# Patient Record
Sex: Female | Born: 1955 | Race: White | Hispanic: No | Marital: Single | Smoking: Never smoker
Health system: Southern US, Community
[De-identification: ages and names within clinical notes are randomized; demographics above are authoritative.]

## PROBLEM LIST (undated history)

## (undated) DIAGNOSIS — Z9889 Other specified postprocedural states: Secondary | ICD-10-CM

## (undated) DIAGNOSIS — R112 Nausea with vomiting, unspecified: Secondary | ICD-10-CM

## (undated) DIAGNOSIS — K219 Gastro-esophageal reflux disease without esophagitis: Secondary | ICD-10-CM

## (undated) DIAGNOSIS — T4145XA Adverse effect of unspecified anesthetic, initial encounter: Secondary | ICD-10-CM

## (undated) DIAGNOSIS — T8859XA Other complications of anesthesia, initial encounter: Secondary | ICD-10-CM

## (undated) DIAGNOSIS — H409 Unspecified glaucoma: Secondary | ICD-10-CM

## (undated) DIAGNOSIS — I10 Essential (primary) hypertension: Secondary | ICD-10-CM

## (undated) DIAGNOSIS — H348192 Central retinal vein occlusion, unspecified eye, stable: Secondary | ICD-10-CM

## (undated) DIAGNOSIS — H3530 Unspecified macular degeneration: Secondary | ICD-10-CM

## (undated) DIAGNOSIS — Z8719 Personal history of other diseases of the digestive system: Secondary | ICD-10-CM

## (undated) HISTORY — PX: CATARACT EXTRACTION: SUR2

## (undated) HISTORY — PX: IRIDOTOMY / IRIDECTOMY: SHX165

## (undated) HISTORY — PX: BREAST BIOPSY: SHX20

## (undated) HISTORY — PX: TONSILLECTOMY: SUR1361

## (undated) HISTORY — DX: Unspecified macular degeneration: H35.30

---

## 1999-01-12 ENCOUNTER — Other Ambulatory Visit: Admission: RE | Admit: 1999-01-12 | Discharge: 1999-01-12 | Payer: Self-pay | Admitting: Emergency Medicine

## 1999-01-17 ENCOUNTER — Encounter: Payer: Self-pay | Admitting: Emergency Medicine

## 1999-01-17 ENCOUNTER — Encounter: Admission: RE | Admit: 1999-01-17 | Discharge: 1999-01-17 | Payer: Self-pay | Admitting: Emergency Medicine

## 1999-05-24 ENCOUNTER — Encounter: Admission: RE | Admit: 1999-05-24 | Discharge: 1999-05-24 | Payer: Self-pay | Admitting: Emergency Medicine

## 1999-05-24 ENCOUNTER — Encounter: Payer: Self-pay | Admitting: Emergency Medicine

## 1999-07-13 ENCOUNTER — Other Ambulatory Visit: Admission: RE | Admit: 1999-07-13 | Discharge: 1999-07-13 | Payer: Self-pay | Admitting: Emergency Medicine

## 1999-07-15 ENCOUNTER — Encounter (INDEPENDENT_AMBULATORY_CARE_PROVIDER_SITE_OTHER): Payer: Self-pay | Admitting: *Deleted

## 1999-07-15 ENCOUNTER — Encounter: Payer: Self-pay | Admitting: Emergency Medicine

## 1999-07-15 ENCOUNTER — Encounter: Admission: RE | Admit: 1999-07-15 | Discharge: 1999-07-15 | Payer: Self-pay | Admitting: Emergency Medicine

## 1999-12-16 ENCOUNTER — Encounter: Payer: Self-pay | Admitting: Emergency Medicine

## 1999-12-16 ENCOUNTER — Encounter: Admission: RE | Admit: 1999-12-16 | Discharge: 1999-12-16 | Payer: Self-pay | Admitting: Emergency Medicine

## 2000-01-19 ENCOUNTER — Other Ambulatory Visit: Admission: RE | Admit: 2000-01-19 | Discharge: 2000-01-19 | Payer: Self-pay | Admitting: Family Medicine

## 2000-09-05 ENCOUNTER — Encounter: Admission: RE | Admit: 2000-09-05 | Discharge: 2000-09-05 | Payer: Self-pay | Admitting: Emergency Medicine

## 2000-09-05 ENCOUNTER — Encounter: Payer: Self-pay | Admitting: Emergency Medicine

## 2000-09-06 ENCOUNTER — Encounter: Payer: Self-pay | Admitting: Emergency Medicine

## 2000-09-06 ENCOUNTER — Encounter: Admission: RE | Admit: 2000-09-06 | Discharge: 2000-09-06 | Payer: Self-pay | Admitting: Emergency Medicine

## 2001-01-22 ENCOUNTER — Encounter: Admission: RE | Admit: 2001-01-22 | Discharge: 2001-01-22 | Payer: Self-pay | Admitting: Emergency Medicine

## 2001-01-22 ENCOUNTER — Encounter: Payer: Self-pay | Admitting: Emergency Medicine

## 2001-11-13 ENCOUNTER — Encounter: Payer: Self-pay | Admitting: Emergency Medicine

## 2001-11-13 ENCOUNTER — Encounter: Admission: RE | Admit: 2001-11-13 | Discharge: 2001-11-13 | Payer: Self-pay | Admitting: Emergency Medicine

## 2001-12-18 ENCOUNTER — Encounter: Payer: Self-pay | Admitting: Emergency Medicine

## 2001-12-18 ENCOUNTER — Encounter: Admission: RE | Admit: 2001-12-18 | Discharge: 2001-12-18 | Payer: Self-pay | Admitting: Emergency Medicine

## 2003-10-28 ENCOUNTER — Encounter: Admission: RE | Admit: 2003-10-28 | Discharge: 2003-10-28 | Payer: Self-pay | Admitting: Emergency Medicine

## 2003-11-25 ENCOUNTER — Encounter: Admission: RE | Admit: 2003-11-25 | Discharge: 2003-11-25 | Payer: Self-pay | Admitting: Emergency Medicine

## 2003-12-29 ENCOUNTER — Encounter: Admission: RE | Admit: 2003-12-29 | Discharge: 2003-12-29 | Payer: Self-pay | Admitting: Emergency Medicine

## 2005-04-13 ENCOUNTER — Encounter: Admission: RE | Admit: 2005-04-13 | Discharge: 2005-04-13 | Payer: Self-pay | Admitting: Emergency Medicine

## 2005-04-20 ENCOUNTER — Encounter: Admission: RE | Admit: 2005-04-20 | Discharge: 2005-04-20 | Payer: Self-pay | Admitting: Emergency Medicine

## 2005-05-22 ENCOUNTER — Ambulatory Visit (HOSPITAL_COMMUNITY): Admission: RE | Admit: 2005-05-22 | Discharge: 2005-05-22 | Payer: Self-pay | Admitting: *Deleted

## 2006-06-27 ENCOUNTER — Encounter: Admission: RE | Admit: 2006-06-27 | Discharge: 2006-06-27 | Payer: Self-pay | Admitting: Emergency Medicine

## 2006-09-29 ENCOUNTER — Emergency Department (HOSPITAL_COMMUNITY): Admission: EM | Admit: 2006-09-29 | Discharge: 2006-09-30 | Payer: Self-pay | Admitting: Emergency Medicine

## 2007-07-02 ENCOUNTER — Encounter: Admission: RE | Admit: 2007-07-02 | Discharge: 2007-07-02 | Payer: Self-pay | Admitting: Emergency Medicine

## 2007-07-19 ENCOUNTER — Encounter: Admission: RE | Admit: 2007-07-19 | Discharge: 2007-07-19 | Payer: Self-pay | Admitting: Emergency Medicine

## 2008-02-14 HISTORY — PX: HERNIA REPAIR: SHX51

## 2008-06-24 ENCOUNTER — Ambulatory Visit: Payer: Self-pay | Admitting: Family Medicine

## 2008-07-09 ENCOUNTER — Ambulatory Visit: Payer: Self-pay | Admitting: Family Medicine

## 2008-07-24 ENCOUNTER — Ambulatory Visit: Payer: Self-pay | Admitting: Unknown Physician Specialty

## 2008-08-07 ENCOUNTER — Ambulatory Visit: Payer: Self-pay | Admitting: Unknown Physician Specialty

## 2008-08-26 ENCOUNTER — Ambulatory Visit: Payer: Self-pay | Admitting: Surgery

## 2008-08-31 ENCOUNTER — Inpatient Hospital Stay: Payer: Self-pay | Admitting: Surgery

## 2008-09-29 ENCOUNTER — Ambulatory Visit: Payer: Self-pay | Admitting: Surgery

## 2008-10-13 ENCOUNTER — Ambulatory Visit: Payer: Self-pay | Admitting: Unknown Physician Specialty

## 2008-10-27 ENCOUNTER — Ambulatory Visit: Payer: Self-pay | Admitting: Surgery

## 2008-11-02 ENCOUNTER — Inpatient Hospital Stay: Payer: Self-pay | Admitting: Surgery

## 2009-05-07 ENCOUNTER — Ambulatory Visit: Payer: Self-pay | Admitting: Surgery

## 2009-05-24 ENCOUNTER — Ambulatory Visit: Payer: Self-pay | Admitting: Unknown Physician Specialty

## 2009-06-01 ENCOUNTER — Ambulatory Visit: Payer: Self-pay | Admitting: Surgery

## 2009-06-07 ENCOUNTER — Inpatient Hospital Stay: Payer: Self-pay | Admitting: Surgery

## 2009-09-09 ENCOUNTER — Ambulatory Visit: Payer: Self-pay | Admitting: Family Medicine

## 2010-03-14 ENCOUNTER — Ambulatory Visit: Payer: Self-pay | Admitting: Unknown Physician Specialty

## 2010-12-29 ENCOUNTER — Ambulatory Visit: Payer: Self-pay | Admitting: Family Medicine

## 2012-02-21 ENCOUNTER — Ambulatory Visit: Payer: Self-pay | Admitting: Family Medicine

## 2013-04-29 ENCOUNTER — Ambulatory Visit: Payer: Self-pay | Admitting: Family Medicine

## 2013-05-20 DIAGNOSIS — Z8701 Personal history of pneumonia (recurrent): Secondary | ICD-10-CM | POA: Insufficient documentation

## 2013-05-20 DIAGNOSIS — K449 Diaphragmatic hernia without obstruction or gangrene: Secondary | ICD-10-CM | POA: Insufficient documentation

## 2013-05-20 DIAGNOSIS — D649 Anemia, unspecified: Secondary | ICD-10-CM | POA: Insufficient documentation

## 2013-08-11 ENCOUNTER — Ambulatory Visit: Payer: Self-pay | Admitting: Unknown Physician Specialty

## 2014-06-10 ENCOUNTER — Ambulatory Visit
Admit: 2014-06-10 | Disposition: A | Discharge status: Home / Self Care | Payer: Self-pay | Attending: Family Medicine | Admitting: Family Medicine

## 2015-10-06 ENCOUNTER — Other Ambulatory Visit: Payer: Self-pay | Admitting: Family Medicine

## 2015-10-06 DIAGNOSIS — Z1231 Encounter for screening mammogram for malignant neoplasm of breast: Secondary | ICD-10-CM

## 2015-10-13 ENCOUNTER — Other Ambulatory Visit: Payer: Self-pay | Admitting: Family Medicine

## 2015-10-13 ENCOUNTER — Ambulatory Visit
Admission: RE | Admit: 2015-10-13 | Discharge: 2015-10-13 | Disposition: A | Discharge status: Home / Self Care | Payer: 59 | Source: Ambulatory Visit | Attending: Family Medicine | Admitting: Family Medicine

## 2015-10-13 DIAGNOSIS — Z1231 Encounter for screening mammogram for malignant neoplasm of breast: Secondary | ICD-10-CM

## 2016-11-01 ENCOUNTER — Other Ambulatory Visit: Payer: Self-pay | Admitting: Family Medicine

## 2016-11-01 DIAGNOSIS — Z1231 Encounter for screening mammogram for malignant neoplasm of breast: Secondary | ICD-10-CM

## 2016-11-09 ENCOUNTER — Ambulatory Visit
Admission: RE | Admit: 2016-11-09 | Discharge: 2016-11-09 | Disposition: A | Discharge status: Home / Self Care | Payer: Managed Care, Other (non HMO) | Source: Ambulatory Visit | Attending: Family Medicine | Admitting: Family Medicine

## 2016-11-09 DIAGNOSIS — Z1231 Encounter for screening mammogram for malignant neoplasm of breast: Secondary | ICD-10-CM | POA: Diagnosis not present

## 2017-09-13 HISTORY — PX: CATARACT EXTRACTION, BILATERAL: SHX1313

## 2017-09-18 ENCOUNTER — Other Ambulatory Visit: Payer: Self-pay | Admitting: General Surgery

## 2017-09-18 DIAGNOSIS — K432 Incisional hernia without obstruction or gangrene: Secondary | ICD-10-CM

## 2017-09-26 ENCOUNTER — Ambulatory Visit
Admission: RE | Admit: 2017-09-26 | Discharge: 2017-09-26 | Disposition: A | Discharge status: Home / Self Care | Payer: Managed Care, Other (non HMO) | Source: Ambulatory Visit | Attending: General Surgery | Admitting: General Surgery

## 2017-09-26 DIAGNOSIS — K439 Ventral hernia without obstruction or gangrene: Secondary | ICD-10-CM | POA: Insufficient documentation

## 2017-09-26 DIAGNOSIS — N200 Calculus of kidney: Secondary | ICD-10-CM | POA: Insufficient documentation

## 2017-09-26 DIAGNOSIS — K449 Diaphragmatic hernia without obstruction or gangrene: Secondary | ICD-10-CM | POA: Diagnosis not present

## 2017-09-26 DIAGNOSIS — K4120 Bilateral femoral hernia, without obstruction or gangrene, not specified as recurrent: Secondary | ICD-10-CM | POA: Insufficient documentation

## 2017-09-26 DIAGNOSIS — K219 Gastro-esophageal reflux disease without esophagitis: Secondary | ICD-10-CM | POA: Diagnosis not present

## 2017-09-26 DIAGNOSIS — K432 Incisional hernia without obstruction or gangrene: Secondary | ICD-10-CM

## 2017-09-26 HISTORY — DX: Essential (primary) hypertension: I10

## 2017-09-26 MED ORDER — IOHEXOL 300 MG/ML  SOLN
100.0000 mL | Freq: Once | INTRAMUSCULAR | Status: AC | PRN
  Administered 2017-09-26: 100 mL via INTRAVENOUS

## 2017-10-03 ENCOUNTER — Encounter: Payer: Self-pay | Admitting: *Deleted

## 2017-10-08 ENCOUNTER — Encounter: Payer: Self-pay | Admitting: Surgery

## 2017-10-08 ENCOUNTER — Ambulatory Visit (INDEPENDENT_AMBULATORY_CARE_PROVIDER_SITE_OTHER): Payer: Managed Care, Other (non HMO) | Admitting: Surgery

## 2017-10-08 VITALS — BP 150/85 | HR 73 | Temp 96.3°F | Ht 67.0 in | Wt 225.2 lb

## 2017-10-08 DIAGNOSIS — K432 Incisional hernia without obstruction or gangrene: Secondary | ICD-10-CM

## 2017-10-08 DIAGNOSIS — H348192 Central retinal vein occlusion, unspecified eye, stable: Secondary | ICD-10-CM | POA: Insufficient documentation

## 2017-10-08 DIAGNOSIS — K449 Diaphragmatic hernia without obstruction or gangrene: Secondary | ICD-10-CM | POA: Diagnosis not present

## 2017-10-08 NOTE — Patient Instructions (Addendum)
I have scheduled an appointment with Dr.Elliot 10/17/17 @ 3:30 . I will call you with the appointment.   Please see your follow up appointment listed below.

## 2017-10-10 ENCOUNTER — Encounter: Payer: Self-pay | Admitting: Surgery

## 2017-10-10 NOTE — Progress Notes (Signed)
Patient ID: Briana Hines, female   DOB: 11/01/1955, 62 y.o.   MRN: [id number]  HPI Briana Easternerry L Bjorn is a 62 y.o. female in consultation at the request of Dr. Hazle QuantCintron-Diaz is discussed with him in detail.  She has issues with a hiatal hernia that Dr. Katrinka BlazingSmith did an open repair around 7 years ago.  Apparently was laparoscopic and then had to do a laparotomy at some point in time.  I do not have any available records.  She presents with significant reflux symptoms.  She does have some gastroesophageal reflux disease with significant burning in her chest.  Symptoms get worse when she lays down and sometimes she does feel acid in her mouth.  No evidence of dysphagia. Came for an enlarging ventral hernia that sometimes causes some discomfort.  This hernia was related to her previous laparotomy.  Dr. Hazle Quantintron-Diaz obtain a CT scan of the abdomen and pelvis that I have personally reviewed showing evidence of moderate size hiatal hernia with at least the third of the stomach within the mediastinum.  Also a ventral hernia on bilateral femoral hernias.  I have also reviewed the previous esophagram from 2015 showing reflux.  Able to perform more than 4 METS of activity without any shortness of breath or chest pain. HPI  Past Medical History:  Diagnosis Date  . Hypertension     Past Surgical History:  Procedure Laterality Date  . BREAST BIOPSY Left 1990's   benign  . CATARACT EXTRACTION, BILATERAL  09/2017  . HERNIA REPAIR  2010   hiatal, incisional    Family History  Problem Relation Age of Onset  . COPD Mother   . Cancer Mother        not sure what type  . Hypertension Mother   . COPD Father   . Heart attack Brother   . High blood pressure Brother     Social History Social History   Tobacco Use  . Smoking status: Never Smoker  . Smokeless tobacco: Never Used  Substance Use Topics  . Alcohol use: Never    Frequency: Never  . Drug use: Never    No Known Allergies  Current Outpatient  Medications  Medication Sig Dispense Refill  . amLODipine (NORVASC) 5 MG tablet Take by mouth.    Marland Kitchen. aspirin EC 81 MG tablet Take 81 mg by mouth daily.    . Cyanocobalamin (B-12) 1000 MCG CAPS Take by mouth.    . latanoprost (XALATAN) 0.005 % ophthalmic solution 1 drop at bedtime.    . metoprolol succinate (TOPROL-XL) 25 MG 24 hr tablet Take by mouth.    . Multiple Vitamin (MULTI-VITAMINS) TABS Take by mouth.    . Multiple Vitamins-Minerals (ICAPS AREDS 2 PO) Take 2 tablets by mouth.    . Omega-3 Fatty Acids (FISH OIL) 1200 MG CAPS Take 1 capsule by mouth.    Marland Kitchen. omeprazole (PRILOSEC) 40 MG capsule Take by mouth.     No current facility-administered medications for this visit.      Review of Systems Full ROS  was asked and was negative except for the information on the HPI  Physical Exam Blood pressure (!) 150/85, pulse 73, temperature (!) 96.3 F (35.7 C), temperature source Oral, height 5\' 7"  (1.702 m), weight 225 lb 3.2 oz (102.2 kg). CONSTITUTIONAL: NAD EYES: Pupils are equal, round, and reactive to light, Sclera are non-icteric. EARS, NOSE, MOUTH AND THROAT: The oropharynx is clear. The oral mucosa is pink and moist. Hearing is intact to voice.  LYMPH NODES:  Lymph nodes in the neck are normal. RESPIRATORY:  Lungs are clear. There is normal respiratory effort, with equal breath sounds bilaterally, and without pathologic use of accessory muscles. CARDIOVASCULAR: Heart is regular without murmurs, gallops, or rubs. GI: The abdomen is soft, nontender, and nondistended. Reducible ventral hernia, no tenderness. Reducible femoral hernias.  There are no palpable masses. There is no hepatosplenomegaly. There are normal bowel sounds in all quadrants. GU: Rectal deferred.   MUSCULOSKELETAL: Normal muscle strength and tone. No cyanosis or edema.   SKIN: Turgor is good and there are no pathologic skin lesions or ulcers. NEUROLOGIC: Motor and sensation is grossly normal. Cranial nerves are  grossly intact. PSYCH:  Oriented to person, place and time. Affect is normal.  Data Reviewed  I have personally reviewed the patient's imaging, laboratory findings and medical records.    Assessment/Plan 62 year old female with recurrent hiatal hernia with reflux and ventral hernia in addition to bilateral femoral hernias.  I had a lengthy discussion with the patient regarding her pathology.  Options of surgical intervention on all of the 4 hernias versus only performing a ventral and hiatal hernia repair were explained to the patient.  Patient is adamant that she only wants to have one surgery and wants to go ahead and fix all the hernias that she has.  I do think that she needs again symptomatology for the hiatal hernia and the ventral hernia and they definitely need to be addressed.  We will obtain an EGD to make sure there is no strictures.  She refuses to have another esophagram because of prior bad experiences.  She does not want to have a manometry either.  I will also obtain some basic labs and I will see her back in about 3 weeks with the results.  Again I do think that she will benefit from robotic surgery performing all these hernias and she understands that is going to be a lengthy procedure.Extensive counseling was performed. Copy of this report will be sent to the referring provider.  Please note that I have personally discussed with the referring provider about this case in detail  Sterling Big, MD FACS General Surgeon 10/10/2017, 1:25 PM

## 2017-10-31 ENCOUNTER — Ambulatory Visit (INDEPENDENT_AMBULATORY_CARE_PROVIDER_SITE_OTHER): Payer: Managed Care, Other (non HMO) | Admitting: Surgery

## 2017-10-31 ENCOUNTER — Encounter: Payer: Self-pay | Admitting: Surgery

## 2017-10-31 ENCOUNTER — Encounter: Payer: Self-pay | Admitting: *Deleted

## 2017-10-31 VITALS — BP 141/85 | HR 65 | Temp 97.7°F | Resp 18 | Ht 67.0 in | Wt 225.0 lb

## 2017-10-31 DIAGNOSIS — K30 Functional dyspepsia: Secondary | ICD-10-CM | POA: Diagnosis not present

## 2017-10-31 DIAGNOSIS — K449 Diaphragmatic hernia without obstruction or gangrene: Secondary | ICD-10-CM

## 2017-10-31 NOTE — Patient Instructions (Addendum)
We will order the below test.  Gastric Emptying Study - [id number] Once the test has been done we will schedule a follow up with Dr.Pabon.

## 2017-10-31 NOTE — Progress Notes (Signed)
Patient has been scheduled for a gastric emptying study at [facility] for 11-17-17 at 10 am (arrive 9:30 am). Prep: NPO after midnight and hold omeprazole 6 hours prior.   The patient has also been scheduled for a follow up appointment with Dr. Everlene FarrierPabon for 11-21-17 at 4 pm.   Patient was instructed to call the office should she have further questions. She verbalizes understanding.

## 2017-10-31 NOTE — Progress Notes (Signed)
Ms. Briana Hines is following up for recurrent paraesophageal hernia with some bloating issues.  She also had bilateral femoral hernias as well as a ventral hernia that are needed to be addressed.  I asked Dr. Mechele CollinElliott to perform an EGD on the EGD shows evidence of digested food within the stomach suggesting delayed gastric emptying.  I am not sure about the etiology of the gastric emptying but it might be related to her previous surgeries and the possibility of previous vagotomy.  PE NAD Abd: reducible ventral hernia, non tender, reducible Femoral hernias.  A/P symptomatically Recurrent hiatal hernia as well as evidence of delayed gastric emptying.  I would like to order a gastric emptying study to confirm my suspicions since it may alter surgical management.  I do suspect that she will benefit from a vagotomy at the same time were doing the hiatal hernia repair and the fundoplication.  I will also obtain a hard copy of all the previous operative reports. Is with her in detail about my thought process.  Please note that I spent greater than 25 minutes in this encounter with the majority of time spent in coordination and counseling of her care

## 2017-11-12 ENCOUNTER — Other Ambulatory Visit: Payer: Self-pay | Admitting: Family Medicine

## 2017-11-12 DIAGNOSIS — Z1231 Encounter for screening mammogram for malignant neoplasm of breast: Secondary | ICD-10-CM

## 2017-11-17 ENCOUNTER — Ambulatory Visit
Admission: RE | Admit: 2017-11-17 | Discharge: 2017-11-17 | Disposition: A | Discharge status: Home / Self Care | Payer: Managed Care, Other (non HMO) | Source: Ambulatory Visit | Attending: Surgery | Admitting: Surgery

## 2017-11-17 DIAGNOSIS — K449 Diaphragmatic hernia without obstruction or gangrene: Secondary | ICD-10-CM | POA: Insufficient documentation

## 2017-11-17 DIAGNOSIS — K30 Functional dyspepsia: Secondary | ICD-10-CM | POA: Diagnosis present

## 2017-11-17 MED ORDER — TECHNETIUM TC 99M SULFUR COLLOID
2.2000 | Freq: Once | INTRAVENOUS | Status: AC | PRN
  Administered 2017-11-17: 2.2 via ORAL

## 2017-11-21 ENCOUNTER — Ambulatory Visit (INDEPENDENT_AMBULATORY_CARE_PROVIDER_SITE_OTHER): Payer: Managed Care, Other (non HMO) | Admitting: Surgery

## 2017-11-21 ENCOUNTER — Encounter: Payer: Self-pay | Admitting: Surgery

## 2017-11-21 VITALS — BP 132/78 | HR 76 | Temp 97.7°F | Resp 18 | Wt 224.0 lb

## 2017-11-21 DIAGNOSIS — K449 Diaphragmatic hernia without obstruction or gangrene: Secondary | ICD-10-CM

## 2017-11-21 DIAGNOSIS — K219 Gastro-esophageal reflux disease without esophagitis: Secondary | ICD-10-CM | POA: Diagnosis not present

## 2017-11-21 MED ORDER — AZITHROMYCIN 250 MG PO TABS: ORAL_TABLET | ORAL | 0 refills | Status: DC

## 2017-11-21 NOTE — Patient Instructions (Addendum)
The patient is aware to call back for any questions or new concerns.  Robotic assisted hiatal hernia and bilateral femoral hernia repair Plan out of work 2 weeks  Hernia, Adult A hernia is the bulging of an organ or tissue through a weak spot in the muscles of the abdomen (abdominal wall). Hernias develop most often near the navel or groin. There are many kinds of hernias. Common kinds include:  Femoral hernia. This kind of hernia develops under the groin in the upper thigh area.  Inguinal hernia. This kind of hernia develops in the groin or scrotum.  Umbilical hernia. This kind of hernia develops near the navel.  Hiatal hernia. This kind of hernia causes part of the stomach to be pushed up into the chest.  Incisional hernia. This kind of hernia bulges through a scar from an abdominal surgery.  What are the causes? This condition may be caused by:  Heavy lifting.  Coughing over a long period of time.  Straining to have a bowel movement.  An incision made during an abdominal surgery.  A birth defect (congenital defect).  Excess weight or obesity.  Smoking.  Poor nutrition.  Cystic fibrosis.  Excess fluid in the abdomen.  Undescended testicles.  What are the signs or symptoms? Symptoms of a hernia include:  A lump on the abdomen. This is the first sign of a hernia. The lump may become more obvious with standing, straining, or coughing. It may get bigger over time if it is not treated or if the condition causing it is not treated.  Pain. A hernia is usually painless, but it may become painful over time if treatment is delayed. The pain is usually dull and may get worse with standing or lifting heavy objects.  Sometimes a hernia gets tightly squeezed in the weak spot (strangulated) or stuck there (incarcerated) and causes additional symptoms. These symptoms may include:  Vomiting.  Nausea.  Constipation.  Irritability.  How is this diagnosed? A hernia may be  diagnosed with:  A physical exam. During the exam your health care provider may ask you to cough or to make a specific movement, because a hernia is usually more visible when you move.  Imaging tests. These can include: ? X-rays. ? Ultrasound. ? CT scan.  How is this treated? A hernia that is small and painless may not need to be treated. A hernia that is large or painful may be treated with surgery. Inguinal hernias may be treated with surgery to prevent incarceration or strangulation. Strangulated hernias are always treated with surgery, because lack of blood to the trapped organ or tissue can cause it to die. Surgery to treat a hernia involves pushing the bulge back into place and repairing the weak part of the abdomen. Follow these instructions at home:  Avoid straining.  Do not lift anything heavier than 10 lb (4.5 kg).  Lift with your leg muscles, not your back muscles. This helps avoid strain.  When coughing, try to cough gently.  Prevent constipation. Constipation leads to straining with bowel movements, which can make a hernia worse or cause a hernia repair to break down. You can prevent constipation by: ? Eating a high-fiber diet that includes plenty of fruits and vegetables. ? Drinking enough fluids to keep your urine clear or pale yellow. Aim to drink 6-8 glasses of water per day. ? Using a stool softener as directed by your health care provider.  Lose weight, if you are overweight.  Do not use any  tobacco products, including cigarettes, chewing tobacco, or electronic cigarettes. If you need help quitting, ask your health care provider.  Keep all follow-up visits as directed by your health care provider. This is important. Your health care provider may need to monitor your condition. Contact a health care provider if:  You have swelling, redness, and pain in the affected area.  Your bowel habits change. Get help right away if:  You have a fever.  You have  abdominal pain that is getting worse.  You feel nauseous or you vomit.  You cannot push the hernia back in place by gently pressing on it while you are lying down.  The hernia: ? Changes in shape or size. ? Is stuck outside the abdomen. ? Becomes discolored. ? Feels hard or tender. This information is not intended to replace advice given to you by your health care provider. Make sure you discuss any questions you have with your health care provider. Document Released: 01/30/2005 Document Revised: 06/30/2015 Document Reviewed: 12/10/2013 Elsevier Interactive Patient Education  2017 [employer].

## 2017-11-22 NOTE — Progress Notes (Signed)
Outpatient Surgical Follow Up  11/22/2017  Briana Hines is an 62 y.o. female.   Chief Complaint  Patient presents with  . Follow-up    gastric emptying study on 11-17-17    HPI: There is a very well-known 62-year-old female with recurrent paraesophageal hernias ventral hernias and bilateral femoral hernias.  She came to me because she wants to have all this procedures during one surgical setting.  I have recently order a gastric emptying study because her EGD showed some undigested food.  Actually the gastric emptying study shows normal gastric function without evidence of delayed gastric emptying.  Past Medical History:  Diagnosis Date  . Hypertension     Past Surgical History:  Procedure Laterality Date  . BREAST BIOPSY Left 1990's   benign  . CATARACT EXTRACTION, BILATERAL  09/2017  . HERNIA REPAIR  2010   hiatal, incisional    Family History  Problem Relation Age of Onset  . COPD Mother   . Cancer Mother        not sure what type  . Hypertension Mother   . COPD Father   . Heart attack Brother   . High blood pressure Brother     Social History:  reports that she has never smoked. She has never used smokeless tobacco. She reports that she does not drink alcohol or use drugs.  Allergies: No Known Allergies  Medications reviewed.    ROS Full ROS performed and is otherwise negative other than what is stated in HPI   BP 132/78   Pulse 76   Temp 97.7 F (36.5 C) (Skin)   Resp 18   Wt 224 lb (101.6 kg)   SpO2 96%   BMI 35.08 kg/m   Physical Exam  Constitutional: She is oriented to person, place, and time. She appears well-developed and well-nourished. No distress.  Neck: Normal range of motion. No JVD present. No tracheal deviation present. No thyromegaly present.  Cardiovascular: Normal rate, regular rhythm and normal heart sounds.  Pulmonary/Chest: Effort normal. No stridor. No respiratory distress. She has no wheezes. She has rales.  Abdominal: Soft.  She exhibits no distension and no mass. There is no tenderness. There is no rebound and no guarding. A hernia is present.  Reducible ventral supraumbilical hernia. No peritonitis  Neurological: She is alert and oriented to person, place, and time. She displays normal reflexes. No cranial nerve deficit. She exhibits normal muscle tone. Coordination normal.  Skin: Skin is warm and dry. Capillary refill takes less than 2 seconds. She is not diaphoretic.  Psychiatric: She has a normal mood and affect. Her behavior is normal. Judgment and thought content normal.  Nursing note and vitals reviewed.     Assessment/Plan: Symptomatic recurrent paraesophageal hernia along with a symptomatic ventral hernia and bilateral femoral hernias. Lengthy discussion with the patient about the proposed procedure.  We will perform a robotic assisted repair of paraesophageal hernia with Nissen vs Partial fundoplication and ventral hernia repair along with bilateral femoral hernias. Discussed with the patient detail about the risk benefit and possible complications including but not limited to: Bleeding, infection, esophageal perforation, worsening of symptoms, recurrence of hernias. Understands that this is a lengthy procedures and she is willing and wants to have this done during a single operative setting.  Also discussed with her the possibility of pyloroplasty as well. Extensive counseling provided   Clarence Cogswell, MD FACS General Surgeon 

## 2017-11-22 NOTE — H&P (View-Only) (Signed)
Outpatient Surgical Follow Up  11/22/2017  Briana Hines is an 62 y.o. female.   Chief Complaint  Patient presents with  . Follow-up    gastric emptying study on 11-17-17    HPI: There is a very well-known 62 year old female with recurrent paraesophageal hernias ventral hernias and bilateral femoral hernias.  She came to me because she wants to have all this procedures during one surgical setting.  I have recently order a gastric emptying study because her EGD showed some undigested food.  Actually the gastric emptying study shows normal gastric function without evidence of delayed gastric emptying.  Past Medical History:  Diagnosis Date  . Hypertension     Past Surgical History:  Procedure Laterality Date  . BREAST BIOPSY Left 1990's   benign  . CATARACT EXTRACTION, BILATERAL  09/2017  . HERNIA REPAIR  2010   hiatal, incisional    Family History  Problem Relation Age of Onset  . COPD Mother   . Cancer Mother        not sure what type  . Hypertension Mother   . COPD Father   . Heart attack Brother   . High blood pressure Brother     Social History:  reports that she has never smoked. She has never used smokeless tobacco. She reports that she does not drink alcohol or use drugs.  Allergies: No Known Allergies  Medications reviewed.    ROS Full ROS performed and is otherwise negative other than what is stated in HPI   BP 132/78   Pulse 76   Temp 97.7 F (36.5 C) (Skin)   Resp 18   Wt 224 lb (101.6 kg)   SpO2 96%   BMI 35.08 kg/m   Physical Exam  Constitutional: She is oriented to person, place, and time. She appears well-developed and well-nourished. No distress.  Neck: Normal range of motion. No JVD present. No tracheal deviation present. No thyromegaly present.  Cardiovascular: Normal rate, regular rhythm and normal heart sounds.  Pulmonary/Chest: Effort normal. No stridor. No respiratory distress. She has no wheezes. She has rales.  Abdominal: Soft.  She exhibits no distension and no mass. There is no tenderness. There is no rebound and no guarding. A hernia is present.  Reducible ventral supraumbilical hernia. No peritonitis  Neurological: She is alert and oriented to person, place, and time. She displays normal reflexes. No cranial nerve deficit. She exhibits normal muscle tone. Coordination normal.  Skin: Skin is warm and dry. Capillary refill takes less than 2 seconds. She is not diaphoretic.  Psychiatric: She has a normal mood and affect. Her behavior is normal. Judgment and thought content normal.  Nursing note and vitals reviewed.     Assessment/Plan: Symptomatic recurrent paraesophageal hernia along with a symptomatic ventral hernia and bilateral femoral hernias. Lengthy discussion with the patient about the proposed procedure.  We will perform a robotic assisted repair of paraesophageal hernia with Nissen vs Partial fundoplication and ventral hernia repair along with bilateral femoral hernias. Discussed with the patient detail about the risk benefit and possible complications including but not limited to: Bleeding, infection, esophageal perforation, worsening of symptoms, recurrence of hernias. Understands that this is a lengthy procedures and she is willing and wants to have this done during a single operative setting.  Also discussed with her the possibility of pyloroplasty as well. Extensive counseling provided   Sterling Big, MD [facility] General Surgeon

## 2017-11-26 ENCOUNTER — Ambulatory Visit
Admission: RE | Admit: 2017-11-26 | Discharge: 2017-11-26 | Disposition: A | Discharge status: Home / Self Care | Payer: Managed Care, Other (non HMO) | Source: Ambulatory Visit | Attending: Family Medicine | Admitting: Family Medicine

## 2017-11-26 ENCOUNTER — Encounter: Payer: Self-pay | Admitting: *Deleted

## 2017-11-26 DIAGNOSIS — Z1231 Encounter for screening mammogram for malignant neoplasm of breast: Secondary | ICD-10-CM | POA: Diagnosis not present

## 2017-11-26 NOTE — Progress Notes (Signed)
Patient's surgery has been scheduled for 12-18-17 at [facility] with Dr. Everlene Farrier.  It is okay for patient to continue an 81 mg aspirin once daily.   The patient is aware to call the office should they have further questions.

## 2017-12-11 ENCOUNTER — Encounter
Admission: RE | Admit: 2017-12-11 | Discharge: 2017-12-11 | Disposition: A | Discharge status: Home / Self Care | Payer: Managed Care, Other (non HMO) | Source: Ambulatory Visit | Attending: Surgery | Admitting: Surgery

## 2017-12-11 ENCOUNTER — Other Ambulatory Visit: Payer: Self-pay

## 2017-12-11 HISTORY — DX: Gastro-esophageal reflux disease without esophagitis: K21.9

## 2017-12-11 HISTORY — DX: Personal history of other diseases of the digestive system: Z87.19

## 2017-12-11 HISTORY — DX: Adverse effect of unspecified anesthetic, initial encounter: T41.45XA

## 2017-12-11 HISTORY — DX: Other specified postprocedural states: R11.2

## 2017-12-11 HISTORY — DX: Other complications of anesthesia, initial encounter: T88.59XA

## 2017-12-11 HISTORY — DX: Other specified postprocedural states: Z98.890

## 2017-12-11 NOTE — Patient Instructions (Signed)
Your procedure is scheduled on: 12-18-17 TUESDAY Report to Same Day Surgery 2nd floor medical mall [facility] Entrance-take elevator on left to 2nd floor.  Check in with surgery information desk.) To find out your arrival time please call [phone] between 1PM - 3PM on 12-17-17 MONDAY  Remember: Instructions that are not followed completely may result in serious medical risk, up to and including death, or upon the discretion of your surgeon and anesthesiologist your surgery may need to be rescheduled.    _x___ 1. Do not eat food after midnight the night before your procedure. NO GUM OR CANDY AFTER MIDNIGHT.  You may drink clear liquids up to 2 hours before you are scheduled to arrive at the hospital for your procedure.  Do not drink clear liquids within 2 hours of your scheduled arrival to the hospital.  Clear liquids include  --Water or Apple juice without pulp  --Clear carbohydrate beverage such as ClearFast or Gatorade  --Black Coffee or Clear Tea (No milk, no creamers, do not add anything to the coffee or Tea   ____Ensure clear carbohydrate drink on the way to the hospital for bariatric patients  ____Ensure clear carbohydrate drink 3 hours before surgery for Dr Rutherford Nail patients if physician instructed.    __x__ 2. No Alcohol for 24 hours before or after surgery.   __x__3. No Smoking or e-cigarettes for 24 prior to surgery.  Do not use any chewable tobacco products for at least 6 hour prior to surgery   ____  4. Bring all medications with you on the day of surgery if instructed.    __x__ 5. Notify your doctor if there is any change in your medical condition     (cold, fever, infections).    x___6. On the morning of surgery brush your teeth with toothpaste and water.  You may rinse your mouth with mouth wash if you wish.  Do not swallow any toothpaste or mouthwash.   Do not wear jewelry, make-up, hairpins, clips or nail polish.  Do not wear lotions, powders, or perfumes. You  may wear deodorant.  Do not shave 48 hours prior to surgery. Men may shave face and neck.  Do not bring valuables to the hospital.    [facility] is not responsible for any belongings or valuables.               Contacts, dentures or bridgework may not be worn into surgery.  Leave your suitcase in the car. After surgery it may be brought to your room.  For patients admitted to the hospital, discharge time is determined by your treatment team.  _  Patients discharged the day of surgery will not be allowed to drive home.  You will need someone to drive you home and stay with you the night of your procedure.    Please read over the following fact sheets that you were given:   [facility] Preparing for Surgery   _x___ TAKE THE FOLLOWING MEDICATION THE MORNING OF SURGERY WITH A SMALL SIP OF WATER. These include:  1. AMLODIPINE (NORVASC)  2. PRILOSEC (OMEPRAZOLE)  3.  4.  5.  6.  ____Fleets enema or Magnesium Citrate as directed.   _x___ Use CHG Soap or sage wipes as directed on instruction sheet   ____ Use inhalers on the day of surgery and bring to hospital day of surgery  ____ Stop Metformin and Janumet 2 days prior to surgery.    ____ Take 1/2 of usual insulin dose the  night before surgery and none on the morning surgery.   _x___ Follow recommendations from Cardiologist, Pulmonologist or PCP regarding stopping Aspirin, Coumadin, Plavix ,Eliquis, Effient, or Pradaxa, and Pletal-STOP ASPIRIN NOW  X____Stop Anti-inflammatories such as Advil, Aleve, Ibuprofen, Motrin, Naproxen, Naprosyn, Goodies powders or aspirin products NOW-OK to take Tylenol    _x___ Stop supplements until after surgery-STOP FISH OIL NOW   ____ Bring C-Pap to the hospital.

## 2017-12-12 ENCOUNTER — Encounter
Admission: RE | Admit: 2017-12-12 | Discharge: 2017-12-12 | Disposition: A | Discharge status: Home / Self Care | Payer: Managed Care, Other (non HMO) | Source: Ambulatory Visit | Attending: Surgery | Admitting: Surgery

## 2017-12-12 DIAGNOSIS — I10 Essential (primary) hypertension: Secondary | ICD-10-CM | POA: Diagnosis not present

## 2017-12-12 DIAGNOSIS — Z01810 Encounter for preprocedural cardiovascular examination: Secondary | ICD-10-CM | POA: Diagnosis present

## 2017-12-17 MED ORDER — CEFAZOLIN SODIUM-DEXTROSE 2-4 GM/100ML-% IV SOLN
2.0000 g | INTRAVENOUS | Status: AC
  Administered 2017-12-18 (×2): 2 g via INTRAVENOUS

## 2017-12-18 ENCOUNTER — Encounter: Payer: Self-pay | Admitting: *Deleted

## 2017-12-18 ENCOUNTER — Inpatient Hospital Stay: Payer: Managed Care, Other (non HMO) | Admitting: Anesthesiology

## 2017-12-18 ENCOUNTER — Encounter
Admission: RE | Disposition: A | Discharge status: Home / Self Care | Payer: Self-pay | Source: Ambulatory Visit | Attending: Surgery

## 2017-12-18 ENCOUNTER — Inpatient Hospital Stay
Admission: RE | Admit: 2017-12-18 | Discharge: 2017-12-21 | DRG: 328 | Disposition: A | Discharge status: Home / Self Care | Payer: Managed Care, Other (non HMO) | Source: Ambulatory Visit | Attending: Surgery | Admitting: Surgery

## 2017-12-18 ENCOUNTER — Other Ambulatory Visit: Payer: Self-pay

## 2017-12-18 DIAGNOSIS — K449 Diaphragmatic hernia without obstruction or gangrene: Secondary | ICD-10-CM | POA: Diagnosis present

## 2017-12-18 DIAGNOSIS — Z9841 Cataract extraction status, right eye: Secondary | ICD-10-CM

## 2017-12-18 DIAGNOSIS — K219 Gastro-esophageal reflux disease without esophagitis: Secondary | ICD-10-CM | POA: Diagnosis not present

## 2017-12-18 DIAGNOSIS — Z9889 Other specified postprocedural states: Secondary | ICD-10-CM

## 2017-12-18 DIAGNOSIS — Z5331 Laparoscopic surgical procedure converted to open procedure: Secondary | ICD-10-CM

## 2017-12-18 DIAGNOSIS — Z9842 Cataract extraction status, left eye: Secondary | ICD-10-CM

## 2017-12-18 DIAGNOSIS — K432 Incisional hernia without obstruction or gangrene: Secondary | ICD-10-CM | POA: Diagnosis present

## 2017-12-18 DIAGNOSIS — I10 Essential (primary) hypertension: Secondary | ICD-10-CM | POA: Diagnosis present

## 2017-12-18 DIAGNOSIS — Z825 Family history of asthma and other chronic lower respiratory diseases: Secondary | ICD-10-CM

## 2017-12-18 DIAGNOSIS — Z8249 Family history of ischemic heart disease and other diseases of the circulatory system: Secondary | ICD-10-CM | POA: Diagnosis not present

## 2017-12-18 DIAGNOSIS — K660 Peritoneal adhesions (postprocedural) (postinfection): Secondary | ICD-10-CM | POA: Diagnosis present

## 2017-12-18 DIAGNOSIS — K4120 Bilateral femoral hernia, without obstruction or gangrene, not specified as recurrent: Secondary | ICD-10-CM | POA: Diagnosis present

## 2017-12-18 DIAGNOSIS — Z8719 Personal history of other diseases of the digestive system: Secondary | ICD-10-CM

## 2017-12-18 HISTORY — PX: ROBOTIC ASSISTED LAPAROSCOPIC REPAIR OF PARAESOPHAGEAL HERNIA: SHX6606

## 2017-12-18 HISTORY — PX: VENTRAL HERNIA REPAIR: SHX424

## 2017-12-18 LAB — CBC
HCT: 35.7 % — ABNORMAL LOW (ref 36.0–46.0)
HEMOGLOBIN: 11.7 g/dL — AB (ref 12.0–15.0)
MCH: 29.8 pg (ref 26.0–34.0)
MCHC: 32.8 g/dL (ref 30.0–36.0)
MCV: 90.8 fL (ref 80.0–100.0)
Platelets: 176 10*3/uL (ref 150–400)
RBC: 3.93 MIL/uL (ref 3.87–5.11)
RDW: 15.3 % (ref 11.5–15.5)
WBC: 15.4 10*3/uL — ABNORMAL HIGH (ref 4.0–10.5)
nRBC: 0 % (ref 0.0–0.2)

## 2017-12-18 LAB — CREATININE, SERUM
CREATININE: 0.82 mg/dL (ref 0.44–1.00)
GFR calc Af Amer: >60 mL/min (ref >60)

## 2017-12-18 SURGERY — ROBOTIC ASSISTED LAPAROSCOPIC REPAIR OF PARAESOPHAGEAL HERNIA
Anesthesia: General

## 2017-12-18 MED ORDER — MIDAZOLAM HCL 2 MG/2ML IJ SOLN
INTRAMUSCULAR | Status: DC | PRN
  Administered 2017-12-18: 2 mg via INTRAVENOUS

## 2017-12-18 MED ORDER — LACTATED RINGERS IV SOLN
INTRAVENOUS | Status: DC
  Administered 2017-12-18 – 2017-12-19 (×4): via INTRAVENOUS

## 2017-12-18 MED ORDER — CEFAZOLIN SODIUM-DEXTROSE 2-4 GM/100ML-% IV SOLN
INTRAVENOUS | Status: AC
  Filled 2017-12-18: qty 100

## 2017-12-18 MED ORDER — CELECOXIB 200 MG PO CAPS
200.0000 mg | ORAL_CAPSULE | ORAL | Status: AC
  Administered 2017-12-18: 200 mg via ORAL

## 2017-12-18 MED ORDER — FENTANYL CITRATE (PF) 100 MCG/2ML IJ SOLN
INTRAMUSCULAR | Status: AC
  Filled 2017-12-18: qty 2

## 2017-12-18 MED ORDER — PROPOFOL 10 MG/ML IV BOLUS
INTRAVENOUS | Status: DC | PRN
  Administered 2017-12-18: 50 mg via INTRAVENOUS
  Administered 2017-12-18: 150 mg via INTRAVENOUS
  Administered 2017-12-18: 20 mg via INTRAVENOUS

## 2017-12-18 MED ORDER — PHENYLEPHRINE HCL 10 MG/ML IJ SOLN
INTRAMUSCULAR | Status: AC
  Filled 2017-12-18: qty 1

## 2017-12-18 MED ORDER — ONDANSETRON HCL 4 MG/2ML IJ SOLN
INTRAMUSCULAR | Status: AC
  Filled 2017-12-18: qty 2

## 2017-12-18 MED ORDER — KETAMINE HCL 50 MG/ML IJ SOLN
INTRAMUSCULAR | Status: AC
  Filled 2017-12-18: qty 10

## 2017-12-18 MED ORDER — KETOROLAC TROMETHAMINE 30 MG/ML IJ SOLN: 30.0000 mg | Freq: Four times a day (QID) | INTRAMUSCULAR | Status: DC | PRN

## 2017-12-18 MED ORDER — ALBUMIN HUMAN 5 % IV SOLN
INTRAVENOUS | Status: DC | PRN
  Administered 2017-12-18 (×2): via INTRAVENOUS

## 2017-12-18 MED ORDER — ENOXAPARIN SODIUM 40 MG/0.4ML ~~LOC~~ SOLN
40.0000 mg | SUBCUTANEOUS | Status: DC
  Administered 2017-12-19 – 2017-12-21 (×3): 40 mg via SUBCUTANEOUS
  Filled 2017-12-18 (×3): qty 0.4

## 2017-12-18 MED ORDER — PROCHLORPERAZINE EDISYLATE 10 MG/2ML IJ SOLN
5.0000 mg | Freq: Four times a day (QID) | INTRAMUSCULAR | Status: DC | PRN
  Administered 2017-12-18: 10 mg via INTRAVENOUS
  Filled 2017-12-18 (×3): qty 2

## 2017-12-18 MED ORDER — ONDANSETRON HCL 4 MG/2ML IJ SOLN: 4.0000 mg | Freq: Four times a day (QID) | INTRAMUSCULAR | Status: DC | PRN

## 2017-12-18 MED ORDER — HEPARIN SODIUM (PORCINE) 5000 UNIT/ML IJ SOLN
5000.0000 [IU] | Freq: Once | INTRAMUSCULAR | Status: AC
  Administered 2017-12-18: 5000 [IU] via SUBCUTANEOUS

## 2017-12-18 MED ORDER — CELECOXIB 200 MG PO CAPS
ORAL_CAPSULE | ORAL | Status: AC
  Administered 2017-12-18: 200 mg via ORAL
  Filled 2017-12-18: qty 1

## 2017-12-18 MED ORDER — ROCURONIUM BROMIDE 100 MG/10ML IV SOLN
INTRAVENOUS | Status: DC | PRN
  Administered 2017-12-18: 45 mg via INTRAVENOUS
  Administered 2017-12-18: 20 mg via INTRAVENOUS
  Administered 2017-12-18: 30 mg via INTRAVENOUS
  Administered 2017-12-18 (×2): 20 mg via INTRAVENOUS
  Administered 2017-12-18: 30 mg via INTRAVENOUS
  Administered 2017-12-18: 5 mg via INTRAVENOUS

## 2017-12-18 MED ORDER — ALBUMIN HUMAN 5 % IV SOLN
INTRAVENOUS | Status: AC
  Filled 2017-12-18: qty 250

## 2017-12-18 MED ORDER — SODIUM CHLORIDE 0.9 % IJ SOLN
INTRAMUSCULAR | Status: AC
  Filled 2017-12-18: qty 50

## 2017-12-18 MED ORDER — LACTATED RINGERS IV SOLN
INTRAVENOUS | Status: DC | PRN
  Administered 2017-12-18 (×2): via INTRAVENOUS

## 2017-12-18 MED ORDER — ONDANSETRON HCL 4 MG/2ML IJ SOLN
INTRAMUSCULAR | Status: DC | PRN
  Administered 2017-12-18: 4 mg via INTRAVENOUS

## 2017-12-18 MED ORDER — ACETAMINOPHEN 500 MG PO TABS
1000.0000 mg | ORAL_TABLET | ORAL | Status: AC
  Administered 2017-12-18: 1000 mg via ORAL

## 2017-12-18 MED ORDER — ONDANSETRON HCL 4 MG/2ML IJ SOLN
4.0000 mg | Freq: Once | INTRAMUSCULAR | Status: AC | PRN
  Administered 2017-12-18: 4 mg via INTRAVENOUS

## 2017-12-18 MED ORDER — PROCHLORPERAZINE MALEATE 10 MG PO TABS
10.0000 mg | ORAL_TABLET | Freq: Four times a day (QID) | ORAL | Status: DC | PRN
  Filled 2017-12-18: qty 1

## 2017-12-18 MED ORDER — ROCURONIUM BROMIDE 50 MG/5ML IV SOLN
INTRAVENOUS | Status: AC
  Filled 2017-12-18: qty 1

## 2017-12-18 MED ORDER — METOPROLOL SUCCINATE ER 25 MG PO TB24
25.0000 mg | ORAL_TABLET | Freq: Every day | ORAL | Status: DC
  Administered 2017-12-18 – 2017-12-20 (×3): 25 mg via ORAL
  Filled 2017-12-18 (×3): qty 1

## 2017-12-18 MED ORDER — EVICEL 5 ML EX KIT
PACK | CUTANEOUS | Status: AC
  Filled 2017-12-18: qty 1

## 2017-12-18 MED ORDER — FENTANYL CITRATE (PF) 100 MCG/2ML IJ SOLN: 25.0000 ug | INTRAMUSCULAR | Status: DC | PRN

## 2017-12-18 MED ORDER — ONDANSETRON 4 MG PO TBDP: 4.0000 mg | ORAL_TABLET | Freq: Four times a day (QID) | ORAL | Status: DC | PRN

## 2017-12-18 MED ORDER — SODIUM CHLORIDE 0.9 % IV SOLN
INTRAVENOUS | Status: DC | PRN
  Administered 2017-12-18: 70 mL

## 2017-12-18 MED ORDER — GABAPENTIN 300 MG PO CAPS
ORAL_CAPSULE | ORAL | Status: AC
  Administered 2017-12-18: 300 mg via ORAL
  Filled 2017-12-18: qty 1

## 2017-12-18 MED ORDER — HEPARIN SODIUM (PORCINE) 5000 UNIT/ML IJ SOLN
INTRAMUSCULAR | Status: AC
  Administered 2017-12-18: 5000 [IU] via SUBCUTANEOUS
  Filled 2017-12-18: qty 1

## 2017-12-18 MED ORDER — EPHEDRINE SULFATE 50 MG/ML IJ SOLN
INTRAMUSCULAR | Status: AC
  Filled 2017-12-18: qty 2

## 2017-12-18 MED ORDER — DEXAMETHASONE SODIUM PHOSPHATE 10 MG/ML IJ SOLN
INTRAMUSCULAR | Status: AC
  Filled 2017-12-18: qty 1

## 2017-12-18 MED ORDER — DEXAMETHASONE SODIUM PHOSPHATE 10 MG/ML IJ SOLN
INTRAMUSCULAR | Status: DC | PRN
  Administered 2017-12-18: 8 mg via INTRAVENOUS

## 2017-12-18 MED ORDER — ROCURONIUM BROMIDE 50 MG/5ML IV SOLN
INTRAVENOUS | Status: AC
  Filled 2017-12-18: qty 3

## 2017-12-18 MED ORDER — ACETAMINOPHEN 500 MG PO TABS
ORAL_TABLET | ORAL | Status: AC
  Administered 2017-12-18: 1000 mg via ORAL
  Filled 2017-12-18: qty 2

## 2017-12-18 MED ORDER — HYDRALAZINE HCL 20 MG/ML IJ SOLN
10.0000 mg | INTRAMUSCULAR | Status: DC | PRN
  Filled 2017-12-18: qty 1

## 2017-12-18 MED ORDER — PROPOFOL 10 MG/ML IV BOLUS
INTRAVENOUS | Status: AC
  Filled 2017-12-18: qty 40

## 2017-12-18 MED ORDER — SUCCINYLCHOLINE CHLORIDE 20 MG/ML IJ SOLN
INTRAMUSCULAR | Status: DC | PRN
  Administered 2017-12-18: 140 mg via INTRAVENOUS

## 2017-12-18 MED ORDER — FENTANYL CITRATE (PF) 100 MCG/2ML IJ SOLN
INTRAMUSCULAR | Status: DC | PRN
  Administered 2017-12-18 (×4): 50 ug via INTRAVENOUS
  Administered 2017-12-18: 100 ug via INTRAVENOUS
  Administered 2017-12-18 (×2): 50 ug via INTRAVENOUS

## 2017-12-18 MED ORDER — EPHEDRINE SULFATE 50 MG/ML IJ SOLN
INTRAMUSCULAR | Status: DC | PRN
  Administered 2017-12-18 (×6): 10 mg via INTRAVENOUS

## 2017-12-18 MED ORDER — LIDOCAINE HCL (CARDIAC) PF 100 MG/5ML IV SOSY
PREFILLED_SYRINGE | INTRAVENOUS | Status: DC | PRN
  Administered 2017-12-18: 100 mg via INTRAVENOUS

## 2017-12-18 MED ORDER — SUGAMMADEX SODIUM 500 MG/5ML IV SOLN
INTRAVENOUS | Status: DC | PRN
  Administered 2017-12-18: 408.4 mg via INTRAVENOUS

## 2017-12-18 MED ORDER — SEVOFLURANE IN SOLN
RESPIRATORY_TRACT | Status: AC
  Filled 2017-12-18: qty 250

## 2017-12-18 MED ORDER — CHLORHEXIDINE GLUCONATE CLOTH 2 % EX PADS: 6.0000 | MEDICATED_PAD | Freq: Once | CUTANEOUS | Status: DC

## 2017-12-18 MED ORDER — EVICEL 5 ML EX KIT
PACK | CUTANEOUS | Status: DC | PRN
  Administered 2017-12-18: 5 mL

## 2017-12-18 MED ORDER — GABAPENTIN 300 MG PO CAPS
300.0000 mg | ORAL_CAPSULE | ORAL | Status: AC
  Administered 2017-12-18: 300 mg via ORAL

## 2017-12-18 MED ORDER — ACETAMINOPHEN 500 MG PO TABS
1000.0000 mg | ORAL_TABLET | Freq: Four times a day (QID) | ORAL | Status: DC
  Administered 2017-12-18 – 2017-12-21 (×8): 1000 mg via ORAL
  Filled 2017-12-18 (×9): qty 2

## 2017-12-18 MED ORDER — BUPIVACAINE LIPOSOME 1.3 % IJ SUSP
INTRAMUSCULAR | Status: AC
  Filled 2017-12-18: qty 20

## 2017-12-18 MED ORDER — LIDOCAINE HCL (PF) 2 % IJ SOLN
INTRAMUSCULAR | Status: AC
  Filled 2017-12-18: qty 10

## 2017-12-18 MED ORDER — BUPIVACAINE-EPINEPHRINE (PF) 0.25% -1:200000 IJ SOLN
INTRAMUSCULAR | Status: AC
  Filled 2017-12-18: qty 30

## 2017-12-18 MED ORDER — BUPIVACAINE HCL 0.25 % IJ SOLN
INTRAMUSCULAR | Status: DC | PRN
  Administered 2017-12-18: 30 mL

## 2017-12-18 MED ORDER — KETAMINE HCL 10 MG/ML IJ SOLN
INTRAMUSCULAR | Status: DC | PRN
  Administered 2017-12-18: 20 mg via INTRAVENOUS
  Administered 2017-12-18: 50 mg via INTRAVENOUS

## 2017-12-18 MED ORDER — OXYCODONE HCL 5 MG PO TABS
5.0000 mg | ORAL_TABLET | ORAL | Status: DC | PRN
  Administered 2017-12-18: 5 mg via ORAL
  Filled 2017-12-18: qty 1

## 2017-12-18 MED ORDER — KETOROLAC TROMETHAMINE 30 MG/ML IJ SOLN
30.0000 mg | Freq: Four times a day (QID) | INTRAMUSCULAR | Status: DC
  Administered 2017-12-18 – 2017-12-21 (×10): 30 mg via INTRAVENOUS
  Filled 2017-12-18 (×10): qty 1

## 2017-12-18 MED ORDER — FENTANYL CITRATE (PF) 250 MCG/5ML IJ SOLN
INTRAMUSCULAR | Status: AC
  Filled 2017-12-18: qty 5

## 2017-12-18 MED ORDER — SUGAMMADEX SODIUM 500 MG/5ML IV SOLN
INTRAVENOUS | Status: AC
  Filled 2017-12-18: qty 5

## 2017-12-18 MED ORDER — SUCCINYLCHOLINE CHLORIDE 20 MG/ML IJ SOLN
INTRAMUSCULAR | Status: AC
  Filled 2017-12-18: qty 1

## 2017-12-18 MED ORDER — PHENYLEPHRINE HCL 10 MG/ML IJ SOLN
INTRAMUSCULAR | Status: DC | PRN
  Administered 2017-12-18 (×5): 50 ug via INTRAVENOUS

## 2017-12-18 MED ORDER — MIDAZOLAM HCL 2 MG/2ML IJ SOLN
INTRAMUSCULAR | Status: AC
  Filled 2017-12-18: qty 2

## 2017-12-18 MED ORDER — LATANOPROST 0.005 % OP SOLN
1.0000 [drp] | Freq: Every day | OPHTHALMIC | Status: DC
  Administered 2017-12-18 – 2017-12-20 (×3): 1 [drp] via OPHTHALMIC
  Filled 2017-12-18: qty 2.5

## 2017-12-18 SURGICAL SUPPLY — 60 items
BULB RESERV EVAC DRAIN JP 100C (MISCELLANEOUS) ×2 IMPLANT
CANISTER SUCT 1200ML W/VALVE (MISCELLANEOUS) ×4 IMPLANT
CANNULA SEALS 8.5MM (CANNULA) ×1
CHLORAPREP W/TINT 26ML (MISCELLANEOUS) ×4 IMPLANT
CORD BIP STRL DISP 12FT (MISCELLANEOUS) ×4 IMPLANT
COVER WAND RF STERILE (DRAPES) ×4 IMPLANT
DEFOGGER SCOPE WARMER CLEARIFY (MISCELLANEOUS) ×4 IMPLANT
DRAIN CHANNEL JP 15F RND 16 (MISCELLANEOUS) ×2 IMPLANT
DRAIN PENROSE 5/8X18 LTX STRL (WOUND CARE) ×2 IMPLANT
DRAPE 3 ARM ACCESS DA VINCI (DRAPES) ×1
DRAPE 3 ARM ACCESS DVNC (DRAPES) ×3 IMPLANT
DRAPE SHEET LG 3/4 BI-LAMINATE (DRAPES) ×8 IMPLANT
ELECT BLADE 6.5 EXT (BLADE) ×2 IMPLANT
ELECT REM PT RETURN 9FT ADLT (ELECTROSURGICAL) ×4
ELECTRODE REM PT RTRN 9FT ADLT (ELECTROSURGICAL) ×3 IMPLANT
GLOVE BIO SURGEON STRL SZ7 (GLOVE) ×16 IMPLANT
GOWN STRL REUS W/ TWL LRG LVL3 (GOWN DISPOSABLE) ×13 IMPLANT
GOWN STRL REUS W/TWL LRG LVL3 (GOWN DISPOSABLE) ×28
GRASPER SUT TROCAR 14GX15 (MISCELLANEOUS) ×4 IMPLANT
IV NS 1000ML (IV SOLUTION) ×4
IV NS 1000ML BAXH (IV SOLUTION) ×3 IMPLANT
KIT PINK PAD W/HEAD ARE REST (MISCELLANEOUS) ×4
KIT PINK PAD W/HEAD ARM REST (MISCELLANEOUS) ×3 IMPLANT
LABEL OR SOLS (LABEL) ×6 IMPLANT
LIGASURE IMPACT 36 18CM CVD LR (INSTRUMENTS) ×2 IMPLANT
MESH HERNIA 3X4 RECT PHASIX (Mesh General) ×1 IMPLANT
MESH HERNIA 7X10 RECT PHASIX (Mesh General) ×1 IMPLANT
MESH PHASIX ST 20CMX25CM (Tissue Mesh) ×2 IMPLANT
NEEDLE HYPO 22GX1.5 SAFETY (NEEDLE) ×4 IMPLANT
PACK LAP CHOLECYSTECTOMY (MISCELLANEOUS) ×4 IMPLANT
PROGRASP ENDOWRIST DA VINCI (INSTRUMENTS) ×1
PROGRASP ENDOWRIST DVNC (INSTRUMENTS) ×3 IMPLANT
SEAL CANN 8.5 DVNC (CANNULA) ×3 IMPLANT
SEALER ENDOWRIST ONE VESSEL (MISCELLANEOUS) ×2 IMPLANT
SLEEVE ADV FIXATION 5X100MM (TROCAR) ×4 IMPLANT
SOLUTION ELECTROLUBE (MISCELLANEOUS) ×4 IMPLANT
SPONGE KITTNER 5P (MISCELLANEOUS) ×2 IMPLANT
SPONGE LAP 18X18 RF (DISPOSABLE) ×20 IMPLANT
STRAP SAFETY 5IN WIDE (MISCELLANEOUS) ×4 IMPLANT
SUT DVC VLOC 180 0 12IN GS21 (SUTURE) ×16
SUT ETHIBOND 0 MO6 C/R (SUTURE) ×4 IMPLANT
SUT MNCRL 4-0 (SUTURE) ×4
SUT MNCRL 4-0 27XMFL (SUTURE) ×3
SUT PDS AB 0 CT1 27 (SUTURE) ×4 IMPLANT
SUT SILK 2 0 (SUTURE) ×4
SUT SILK 2 0 SH (SUTURE) ×8 IMPLANT
SUT SILK 2 0SH CR/8 30 (SUTURE) ×4 IMPLANT
SUT SILK 2-0 18XBRD TIE 12 (SUTURE) ×1 IMPLANT
SUT VIC AB 0 CT1 36 (SUTURE) ×4 IMPLANT
SUT VIC AB 0 CT2 27 (SUTURE) ×4 IMPLANT
SUT VICRYL 0 AB UR-6 (SUTURE) ×8 IMPLANT
SUT VLOC 90 2/L VL 12 GS22 (SUTURE) ×8 IMPLANT
SUTURE DVC VLC 180 0 12IN GS21 (SUTURE) ×8 IMPLANT
SUTURE MNCRL 4-0 27XMF (SUTURE) ×3 IMPLANT
SYR 20CC LL (SYRINGE) ×4 IMPLANT
SYR 3ML LL SCALE MARK (SYRINGE) ×4 IMPLANT
TROCAR 130MM GELPORT  DAV (MISCELLANEOUS) ×4 IMPLANT
TROCAR BALLN GELPORT 12X130M (ENDOMECHANICALS) ×4 IMPLANT
TROCAR XCEL NON-BLD 5MMX100MML (ENDOMECHANICALS) ×4 IMPLANT
TUBING INSUF HEATED (TUBING) ×4 IMPLANT

## 2017-12-18 NOTE — Anesthesia Post-op Follow-up Note (Signed)
Anesthesia QCDR form completed.        

## 2017-12-18 NOTE — Transfer of Care (Signed)
Immediate Anesthesia Transfer of Care Note  Patient: MARIONA SCHOLES  Procedure(s) Performed: ATTEMPTED ROBOTIC ASSISTED LAPAROSCOPIC REPAIR OF PARAESOPHAGEAL HERNIA (N/A ) HERNIA REPAIR VENTRAL ADULT  Patient Location: PACU  Anesthesia Type:General  Level of Consciousness: awake  Airway & Oxygen Therapy: Patient connected to face mask oxygen  Post-op Assessment: Post -op Vital signs reviewed and stable  Post vital signs: stable  Last Vitals:  Vitals Value Taken Time  BP 116/81 12/18/2017  2:12 PM  Temp 36.6 C 12/18/2017  2:12 PM  Pulse 82 12/18/2017  2:23 PM  Resp 16 12/18/2017  2:23 PM  SpO2 100 % 12/18/2017  2:23 PM  Vitals shown include unvalidated device data.  Last Pain:  Vitals:   12/18/17 1412  TempSrc:   PainSc: Asleep         Complications: No apparent anesthesia complications

## 2017-12-18 NOTE — Op Note (Addendum)
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  Attempted Robotic paraesophageal hernia repair 2. Open paraesophageal hernia repair with Phasix St Mesh with Nissen fundoplication over a 52 French bougie 3.  Pyloroplasty Heineke-Mikulicz 4. Open ventral Hernia repair using 25x20 cms Phasix Mesh   Surgeon: Sterling Big, MD FACS  Anesthesia: Gen. with endotracheal tube  Assistant:Dr. Cintron-Diaz initial adequate exposure and due to the complexity of the case   Findings: Current paraesophageal hernia with significant scarring unable to definitively identify of the vagus nerve anteriorly. Questionable prior iatrogenic injury unDigested food within the stomach suggestive of gastric emptying dysfunction  Estimated Blood Loss: 300cc         Drains: 15 blake sub q         Specimens: hernia sac       Complications: none   Procedure Details  The patient was seen again in the Holding Room. The benefits, complications, treatment options, and expected outcomes were discussed with the patient. The risks of bleeding, infection, recurrence of symptoms, failure to resolve symptoms, bile duct damage,esophagela injury, pneumothorax, r bowel injury, any of which could require further surgery  were reviewed with the patient. The likelihood of improving the patient's symptoms with return to their baseline status is good.  The patient and/or family concurred with the proposed plan, giving informed consent.  The patient was taken to Operating Room, identified as Daryl Eastern and the procedure verified . A Time Out was held and the above information confirmed.  Prior to the induction of general anesthesia, antibiotic prophylaxis was administered. VTE prophylaxis was in place. General endotracheal anesthesia was then administered and tolerated well. After the induction, the abdomen was prepped  with Chloraprep and draped in the sterile fashion. The patient was positioned in the supine position.     Medical incision was created over the hernia sac.  Circumferential dissection was performed and the hernia sac was dissected circumferentially.  The hernia sac was excised with good piece of omentum chronically incarcerated.  Were able to reduce the hernia.  The abdominal cavity was entered under direct visualization and to stay sutures were placed.  The [facility] trocar was placed through the hernia defect. Pneumoperitoneum was then created with CO2 and tolerated well without any adverse changes in the patient's vital signs.  Three 8-mm ports were placed  under direct vision.  The patient was positioned  in reverse Trendelenburg. The robot was brought to the surgical field and docked in the standard fashion.  We made sure there was no collision between the arms.  We started our dissection and extensive lysis of adhesions were performed there was significant adhesions from the omentum to the abdominal wall.  We were able to take those down with a combination of scissors and the vessel sealer.  We also were able to take down the falciform ligament in the standard fashion.  We visualize significant adhesions mainly on the lesser curvature.  The liver was also attached to the lesser curvature.  This dissection was carried out to try to identify the right crew.  There was significant scarring on the esophagus was attached to the anterior crew with multiple Ethibond sutures.  After extensive and meticulous dissection I felt that I was not gaining much exposure.  After about an hour and 15 minutes of consult time I decided because of the exposure was poor and this being a redo surgery that the safest thing for  the patient was to proceed with conversion to open.  Were able to undocked the robot and removed all the robotic instruments. Patient was repositioned and upper midline laparotomy was performed using a 10  blade knife.  Electrocautery was used to dissect through subcutaneous tissue.  To be visualized and no other defect of the midline fascia that was about 3 cm from the previous one.  A Bookwalter retractor was placed to gain adequate visualization to the forgot.  Again meticulous dissection was performed at pars flaccida approach was able to be accessed.  Were able to take down the structures of the lesser sac all the way to the right crew.  There was better exposure. Return to the greater curvature of the stomach where I was able to take down the short gastrics with LigaSure device.  The fundus was obviously in the intrathoracic portion.  We were able to reduce the fundus of the stomach and reduce the far esophageal hernia after meticulous dissection.  The hernia sac was excised and the GE junction was brought to the intra-abdominal cavity.  Circumferential dissection of the esophagus was performed with a very meticulous dissection.  Anteriorly there were multiple Ethibond sutures.  I was unable to clearly identified the anterior vagus nerve and there was evidence of significant dissection in that area.  Once I had adequate mobilization adequate esophageal length we were able to reinforce the posterior crew with 2 Ethibond sutures within the defect.  Also using Ethibond ST mesh we place the mesh posteriorly to reinforce the repair.  2 interrupted Ethibond sutures were used to tack the mesh to the crew and reinforce it with AdvaSeal for more adherence. A 52 French bougie was placed without any issues and a Nissen fundoplication was created about 4 cm in length with multiple Ethibond sutures incorporated the seromuscular layer of the esophagus.  We turned attention to the pylorus and a Kocher maneuver was performed for adequate mobilization of the first and second portion of the duodenum.  There was definitely some thickening of the pylorus and some undigested food suggestive of gastric emptying.  Because of the  uncertainty of the gastric emptying and uncertainty about the viability of the vagus nerve from previous dissections I decided to perform a pyloromyotomy.  Perform this with electrocautery and until we visualize the mucosa.  We closed the defect in a longitudinal fashion performing a Heineke-Mikulicz plasty with interrupted 0 silk sutures.  There Is no evidence of a leak.  I was very happy with the wrap and it was a floppy wrap and we were able to accomplish the main pillars of paraesophageal hernia to include restoration of the GE junction intra-abdominal.  Restoration of the gastric anatomy excision of the sac and fundoplication. Irrigated the abdomen in the standard fashion and closed the fascia with a running 0 PDS suture using the small bite technique with a 4-1 ratio.  Because of the 2 previous hernias and because of the potential for contamination after the pyloroplasty I decided to place a 25 x 20 cm Phosix-in an overlay fashion.  We attached it to the abdominal wall using continuous 0V lock suture.  As this mesh we had to develop random cutaneous flaps but again I was very happy with the repair.  Drain was placed in the subtenons tissue to prevent any seroma formation.  The wound was irrigated and the subtenons tissue was closed with a running 2-0 Vicryl and the skin was closed with staples. liposomal Marcaine was used  for postoperative analgesia and was infiltrated around all incisions.      The patient was then extubated and brought to the recovery room in stable condition. Sponge, lap, and needle counts were correct at closure and at the conclusion of the case.               Sterling Big, MD, FACS

## 2017-12-18 NOTE — Anesthesia Procedure Notes (Signed)
Procedure Name: Intubation Date/Time: 12/18/2017 7:42 AM Performed by: Lavone Orn, CRNA Pre-anesthesia Checklist: Patient identified, Emergency Drugs available, Suction available, Patient being monitored and Timeout performed Patient Re-evaluated:Patient Re-evaluated prior to induction Oxygen Delivery Method: Circle system utilized Preoxygenation: Pre-oxygenation with 100% oxygen Induction Type: IV induction, Cricoid Pressure applied and Rapid sequence Laryngoscope Size: Mac and 4 Grade View: Grade III Tube type: Oral Tube size: 7.0 mm Number of attempts: 1

## 2017-12-18 NOTE — Anesthesia Postprocedure Evaluation (Signed)
Anesthesia Post Note  Patient: Briana Hines  Procedure(s) Performed: ATTEMPTED ROBOTIC ASSISTED LAPAROSCOPIC REPAIR OF PARAESOPHAGEAL HERNIA (N/A ) HERNIA REPAIR VENTRAL ADULT  Patient location during evaluation: PACU Anesthesia Type: General Level of consciousness: awake and alert Pain management: pain level controlled Vital Signs Assessment: post-procedure vital signs reviewed and stable Respiratory status: spontaneous breathing and respiratory function stable Cardiovascular status: stable Anesthetic complications: no     Last Vitals:  Vitals:   12/18/17 0616 12/18/17 1412  BP: 137/85 116/81  Pulse: 74 86  Resp: 16 12  Temp: (!) 36.3 C 36.6 C  SpO2: 98% 100%    Last Pain:  Vitals:   12/18/17 1412  TempSrc:   PainSc: Asleep                 KEPHART,WILLIAM K

## 2017-12-18 NOTE — Interval H&P Note (Signed)
History and Physical Interval Note:  12/18/2017 7:18 AM  Briana Hines  has presented today for surgery, with the diagnosis of RECURRENT HIATAL HERNIA WITH BILATERAL  The various methods of treatment have been discussed with the patient and family. After consideration of risks, benefits and other options for treatment, the patient has consented to  Procedure(s): ROBOTIC ASSISTED LAPAROSCOPIC REPAIR OF PARAESOPHAGEAL HERNIA (N/A) ROBOTIC LAPAROSCOPICHERNIA REPAIR FEMORAL (Bilateral) as a surgical intervention .  The patient's history has been reviewed, patient examined, no change in status, stable for surgery.  I have reviewed the patient's chart and labs.  Questions were answered to the patient's satisfaction.     Briana Hines

## 2017-12-18 NOTE — Anesthesia Preprocedure Evaluation (Signed)
Anesthesia Evaluation  Patient identified by MRN, date of birth, ID band Patient awake    Reviewed: Allergy & Precautions, NPO status , Patient's Chart, lab work & pertinent test results, reviewed documented beta blocker date and time   History of Anesthesia Complications (+) PONV and history of anesthetic complications  Airway Mallampati: III       Dental   Pulmonary neg sleep apnea, neg COPD,           Cardiovascular hypertension, Pt. on medications and Pt. on home beta blockers (-) Past MI and (-) CHF (-) dysrhythmias (-) Valvular Problems/Murmurs     Neuro/Psych neg Seizures    GI/Hepatic Neg liver ROS, hiatal hernia, GERD  Medicated and Poorly Controlled,  Endo/Other  neg diabetes  Renal/GU negative Renal ROS     Musculoskeletal   Abdominal   Peds  Hematology  (+) anemia ,   Anesthesia Other Findings   Reproductive/Obstetrics                            Anesthesia Physical Anesthesia Plan  ASA: III  Anesthesia Plan: General   Post-op Pain Management:    Induction: Intravenous and Rapid sequence  PONV Risk Score and Plan: 4 or greater and Ondansetron, Dexamethasone, Midazolam and Scopolamine patch - Pre-op  Airway Management Planned: Oral ETT  Additional Equipment:   Intra-op Plan:   Post-operative Plan:   Informed Consent: I have reviewed the patients History and Physical, chart, labs and discussed the procedure including the risks, benefits and alternatives for the proposed anesthesia with the patient or authorized representative who has indicated his/her understanding and acceptance.     Plan Discussed with:   Anesthesia Plan Comments:         Anesthesia Quick Evaluation

## 2017-12-19 ENCOUNTER — Encounter: Payer: Self-pay | Admitting: Surgery

## 2017-12-19 LAB — CBC
HCT: 33.4 % — ABNORMAL LOW (ref 36.0–46.0)
Hemoglobin: 10.8 g/dL — ABNORMAL LOW (ref 12.0–15.0)
MCH: 29.3 pg (ref 26.0–34.0)
MCHC: 32.3 g/dL (ref 30.0–36.0)
MCV: 90.5 fL (ref 80.0–100.0)
NRBC: 0 % (ref 0.0–0.2)
PLATELETS: 173 10*3/uL (ref 150–400)
RBC: 3.69 MIL/uL — ABNORMAL LOW (ref 3.87–5.11)
RDW: 15.5 % (ref 11.5–15.5)
WBC: 11.5 10*3/uL — AB (ref 4.0–10.5)

## 2017-12-19 LAB — MAGNESIUM: Magnesium: 1.8 mg/dL (ref 1.7–2.4)

## 2017-12-19 LAB — BASIC METABOLIC PANEL
ANION GAP: 9 (ref 5–15)
BUN: 14 mg/dL (ref 8–23)
CALCIUM: 8.3 mg/dL — AB (ref 8.9–10.3)
CO2: 26 mmol/L (ref 22–32)
CREATININE: 0.68 mg/dL (ref 0.44–1.00)
Chloride: 106 mmol/L (ref 98–111)
Glucose, Bld: 139 mg/dL — ABNORMAL HIGH (ref 70–99)
Potassium: 3.8 mmol/L (ref 3.5–5.1)
Sodium: 141 mmol/L (ref 135–145)

## 2017-12-19 LAB — PHOSPHORUS: Phosphorus: 3.6 mg/dL (ref 2.5–4.6)

## 2017-12-19 NOTE — Progress Notes (Signed)
[facility] Progress Note  1 Day Post-Op  Subjective: No acute events overnight. Patient tolerated clear liquids this morning without increase in pain, nausea, or emesis. She notes abdominal soreness near her incision. Did mobilize around the unit this morning. No flatus.   Objective: Vital signs in last 24 hours: Temp:  [97.8 F (36.6 C)-98.5 F (36.9 C)] 98.4 F (36.9 C) (11/06 0511) Pulse Rate:  [80-103] 98 (11/06 0511) Resp:  [12-20] 20 (11/06 0511) BP: (116-168)/(76-91) 119/76 (11/06 0511) SpO2:  [92 %-100 %] 97 % (11/06 0511) Last BM Date: 12/17/17  Intake/Output from previous day: 11/05 0701 - 11/06 0700 In: 5104.6 [P.O.:360; I.V.:4244.6; IV Piggyback:500] Out: 3405 [Urine:3080; Drains:25; Blood:300] Intake/Output this shift: Total I/O In: 173.8 [I.V.:173.8] Out: 400 [Urine:400]  PE: Gen:  Alert, NAD, pleasant Pulm:  Normal effort Abd: Soft, expected incisional tenderness, non-distended, midline laparotomy incision is clean and intact, minimal drainage near inferior portion of bandage. Laparoscopic incisions are CDI without erythema or drainage. JP in LUQ with serosanguinous fluid in bulb.  Skin: warm and dry, no rashes  Psych: A&Ox3   Lab Results:  Recent Labs    12/18/17 1626 12/19/17 0251  WBC 15.4* 11.5*  HGB 11.7* 10.8*  HCT 35.7* 33.4*  PLT 176 173   BMET Recent Labs    12/18/17 1626 12/19/17 0251  NA  --  141  K  --  3.8  CL  --  106  CO2  --  26  GLUCOSE  --  139*  BUN  --  14  CREATININE 0.82 0.68  CALCIUM  --  8.3*   PT/INR No results for input(s): LABPROT, INR in the last 72 hours. CMP     Component Value Date/Time   NA 141 12/19/2017 0251   K 3.8 12/19/2017 0251   CL 106 12/19/2017 0251   CO2 26 12/19/2017 0251   GLUCOSE 139 (H) 12/19/2017 0251   BUN 14 12/19/2017 0251   CREATININE 0.68 12/19/2017 0251   CALCIUM 8.3 (L) 12/19/2017 0251   GFRNONAA >60 12/19/2017 0251   GFRAA >60 12/19/2017 0251   Lipase  No  results found for: LIPASE     Studies/Results: No results found.  Anti-infectives: Anti-infectives (From admission, onward)   Start     Dose/Rate Route Frequency Ordered Stop   12/18/17 0600  ceFAZolin (ANCEF) IVPB 2g/100 mL premix     2 g 200 mL/hr over 30 Minutes Intravenous On call to O.R. 12/17/17 2147 12/18/17 1148   12/18/17 0555  ceFAZolin (ANCEF) 2-4 GM/100ML-% IVPB    Note to Pharmacy:  Mike Craze   : cabinet override      12/18/17 0555 12/18/17 0743       Assessment/Plan  Briana Hines is a 62 y.o. female who is 1 day s/p open paraesophageal hernia repair with mild leukocytosis most likely a stress reaction from surgery which is complicated by pertinent co-morbidities including HTN and obesity.    - Advanced full liquid diet this morning, no carbonation, advance as tolerates  - Monitor on-going bowel function and abdominal exam  - JP with 25 ccs in 24 hours per chart review(Serosanuinous), continue  - Pain control (minimize narcotics) PRN, anti-emetics PRN  - Mobilize  - DVT Prophylaxis   -- Lynden Oxford , PA-C [facility] 12/19/2017, 9:25 AM [phone] M-F: 7am - 4pm

## 2017-12-20 LAB — SURGICAL PATHOLOGY

## 2017-12-20 LAB — HIV ANTIBODY (ROUTINE TESTING W REFLEX): HIV SCREEN 4TH GENERATION: NONREACTIVE

## 2017-12-20 NOTE — Progress Notes (Signed)
[facility] Progress Note  2 Days Post-Op  Subjective: No acute events overnight. Patient tolerated soft diet. Minimal intermittent abdominal soreness but she is without nausea or emesis. No flatus of BM. Mobilizing well.   Objective: Vital signs in last 24 hours: Temp:  [98.3 F (36.8 C)-98.4 F (36.9 C)] 98.4 F (36.9 C) (11/07 0742) Pulse Rate:  [92-110] 92 (11/07 0742) Resp:  [16-24] 16 (11/07 0742) BP: (122-148)/(73-78) 148/78 (11/07 0742) SpO2:  [93 %-97 %] 94 % (11/07 0742) Last BM Date: 12/17/17  Intake/Output from previous day: 11/06 0701 - 11/07 0700 In: 173.8 [I.V.:173.8] Out: 2680 [Urine:2525; Drains:155] Intake/Output this shift: No intake/output data recorded.  PE: Gen:  Alert, NAD, pleasant Pulm:  Normal effort Abd: Soft, expected incisional tenderness, non-distended, midline laparotomy incision is clean and intact, minimal drainage near inferior portion of bandage. Laparoscopic incisions are CDI without erythema or drainage. JP in LUQ with serosanguinous fluid in bulb.  Skin: warm and dry, no rashes  Psych: A&Ox3   Lab Results:  Recent Labs    12/18/17 1626 12/19/17 0251  WBC 15.4* 11.5*  HGB 11.7* 10.8*  HCT 35.7* 33.4*  PLT 176 173   BMET Recent Labs    12/18/17 1626 12/19/17 0251  NA  --  141  K  --  3.8  CL  --  106  CO2  --  26  GLUCOSE  --  139*  BUN  --  14  CREATININE 0.82 0.68  CALCIUM  --  8.3*   PT/INR No results for input(s): LABPROT, INR in the last 72 hours. CMP     Component Value Date/Time   NA 141 12/19/2017 0251   K 3.8 12/19/2017 0251   CL 106 12/19/2017 0251   CO2 26 12/19/2017 0251   GLUCOSE 139 (H) 12/19/2017 0251   BUN 14 12/19/2017 0251   CREATININE 0.68 12/19/2017 0251   CALCIUM 8.3 (L) 12/19/2017 0251   GFRNONAA >60 12/19/2017 0251   GFRAA >60 12/19/2017 0251   Lipase  No results found for: LIPASE     Studies/Results: No results found.  Anti-infectives: Anti-infectives (From  admission, onward)   Start     Dose/Rate Route Frequency Ordered Stop   12/18/17 0600  ceFAZolin (ANCEF) IVPB 2g/100 mL premix     2 g 200 mL/hr over 30 Minutes Intravenous On call to O.R. 12/17/17 2147 12/18/17 1148   12/18/17 0555  ceFAZolin (ANCEF) 2-4 GM/100ML-% IVPB    Note to Pharmacy:  Mike Craze   : cabinet override      12/18/17 0555 12/18/17 0743       Assessment/Plan Briana Hines is a 62 y.o. female who is 2 days s/p open paraesophageal hernia repair with improving mild leukocytosis most likely a stress reaction from surgery which is complicated by pertinent co-morbidities including HTN and obesity.               - Advanced soft nissen diet this morning, no carbonation -- Provided diet handout             - Monitor on-going bowel function and abdominal exam             - JP with 155 ccs in 24 hours per chart review(Serosanuinous), continue             - Pain control (minimize narcotics) PRN, anti-emetics PRN             - Mobilize             -  DVT Prophylaxis   - Discharge planning: Home tomorrow   LOS: 2 days    Lynden Oxford , [facility] 12/20/2017, 7:49 AM [phone] M-F: 7am - 4pm

## 2017-12-21 MED ORDER — OXYCODONE HCL 5 MG PO TABS: 5.0000 mg | ORAL_TABLET | ORAL | 0 refills | Status: DC | PRN

## 2017-12-21 NOTE — Progress Notes (Signed)
Patient discharge teaching given, including activity, diet, follow-up appoints, and medications. Patient verbalized understanding of all discharge instructions. IV access was d/c'd. Vitals are stable. Skin is intact except as charted in most recent assessments. JP drain removed per order. Pt to be escorted out by volunteer, to be driven home by family.  Janis Sol [employer]

## 2017-12-21 NOTE — Discharge Instructions (Signed)
In addition to included general post-operative instructions for paraesophageal hernia repair,  Diet: Resume nissen fundoplication diet.   Activity: No heavy lifting >20 pounds (children, pets, laundry, garbage) or strenuous activity until follow-up, but light activity and walking are encouraged. Do not drive or drink alcohol if taking narcotic pain medications.  Wound care: You may shower/get incision wet with soapy water and pat dry (do not rub incisions), but no baths or submerging incision underwater until follow-up.   Medications: Resume all home medications. For mild to moderate pain: acetaminophen (Tylenol). Combining Tylenol with alcohol can substantially increase your risk of causing liver disease. Narcotic pain medications, if prescribed, can be used for severe pain, though may cause nausea, constipation, and drowsiness. Do not combine Tylenol and Percocet (or similar) within a 6 hour period as Percocet (and similar) contain(s) Tylenol. If you do not need the narcotic pain medication, you do not need to fill the prescription.  Call office [phone] / [phone]) at any time if any questions, worsening pain, fevers/chills, bleeding, drainage from incision site, or other concerns.

## 2017-12-21 NOTE — Discharge Summary (Signed)
Discharge Summary  Patient ID: Briana Hines MRN: [id number] DOB/AGE: January 28, 1956 62 y.o.  Admit date: 12/18/2017 Discharge date: 12/21/2017  Discharge Diagnoses Patient Active Problem List   Diagnosis Date Noted  . S/P repair of paraesophageal hernia 12/18/2017  . Hiatal hernia with gastroesophageal reflux   . CRVO (central retinal vein occlusion) 10/08/2017  . Hiatal hernia 05/20/2013  . Anemia, unspecified 05/20/2013  . History of pneumonia 05/20/2013    Consultants None  Procedures open paraesophageal hernia repair on 11/05  WUJ:[id number] L Whidden is a 62 y.o. female with known paraesophageal hernia who presents to [facility] on 11/05 for scheduled paraesophageal hernia repair.   Hospital Course: Informed consent was obtained and documented, and patient underwent attempted robot-assisted laparoscopic paraesophageal hernia repair which was converted to open given extensive adhesions (Dr Everlene Farrier, MD, 12/18/2017).  Post-operatively, patient's pain improved/resolved and advancement of patient's diet and ambulation were well-tolerated. The remainder of patient's hospital course was essentially unremarkable, and discharge planning was initiated accordingly with patient safely able to be discharged home with appropriate discharge instructions, pain control, and outpatient follow-up after all of her  questions were answered to her expressed satisfaction.  Physical Examination:  Gen: Alert, NAD, pleasant Pulm: Normal effort Abd: Soft,expected incisional tenderness, non-distended,midline laparotomy incision is clean and intact. Laparoscopic incisions are CDI without erythema or drainage. JP in LUQ with serosanguinous fluid in bulb. Skin: warm and dry, no rashes  Psych: A&Ox3     Allergies as of 12/21/2017   No Known Allergies     Medication List    STOP taking these medications   omeprazole 40 MG capsule Commonly known as:  PRILOSEC     TAKE these medications   amLODipine 5 MG  tablet Commonly known as:  NORVASC Take 5 mg by mouth every morning.   aspirin EC 81 MG tablet Take 81 mg by mouth daily.   azithromycin 250 MG tablet Commonly known as:  ZITHROMAX As directed   B-12 1000 MCG Caps Take 1,000 mcg by mouth daily.   Fish Oil 1200 MG Caps Take 1,200 mg by mouth daily.   ICAPS AREDS 2 PO Take 1 tablet by mouth 2 (two) times daily.   latanoprost 0.005 % ophthalmic solution Commonly known as:  XALATAN Place 1 drop into both eyes at bedtime.   metoprolol succinate 25 MG 24 hr tablet Commonly known as:  TOPROL-XL Take 25 mg by mouth at bedtime.   MULTI-VITAMINS Tabs Take 1 tablet by mouth 3 (three) times a week.   oxyCODONE 5 MG immediate release tablet Commonly known as:  Oxy IR/ROXICODONE Take 1 tablet (5 mg total) by mouth every 4 (four) hours as needed for severe pain or breakthrough pain.        Follow-up Information    P[place]on, [place] F, MD Follow up in 10 day(s).   Specialty:  General Surgery Why:  s/p open paraesophageal hernia repair with Dr. Leonides Grills information: [address] [place] [address] [phone]           Signed: Lynden Oxford , PA-C [facility]  12/21/2017, 9:18 AM [phone] M-F: 7am - 4pm

## 2017-12-24 ENCOUNTER — Emergency Department: Payer: Managed Care, Other (non HMO)

## 2017-12-24 ENCOUNTER — Encounter: Payer: Self-pay | Admitting: Surgery

## 2017-12-24 ENCOUNTER — Ambulatory Visit (INDEPENDENT_AMBULATORY_CARE_PROVIDER_SITE_OTHER): Payer: Managed Care, Other (non HMO) | Admitting: Surgery

## 2017-12-24 ENCOUNTER — Telehealth: Payer: Self-pay | Admitting: Surgery

## 2017-12-24 ENCOUNTER — Other Ambulatory Visit: Payer: Self-pay

## 2017-12-24 ENCOUNTER — Encounter: Payer: Self-pay | Admitting: Emergency Medicine

## 2017-12-24 ENCOUNTER — Inpatient Hospital Stay
Admission: EM | Admit: 2017-12-24 | Discharge: 2017-12-28 | DRG: 392 | Disposition: A | Discharge status: Home / Self Care | Payer: Managed Care, Other (non HMO) | Attending: Surgery | Admitting: Surgery

## 2017-12-24 VITALS — BP 166/97 | HR 93 | Temp 97.9°F | Resp 16 | Ht 67.0 in | Wt 211.0 lb

## 2017-12-24 DIAGNOSIS — R5082 Postprocedural fever: Secondary | ICD-10-CM | POA: Diagnosis present

## 2017-12-24 DIAGNOSIS — E876 Hypokalemia: Secondary | ICD-10-CM | POA: Diagnosis present

## 2017-12-24 DIAGNOSIS — R112 Nausea with vomiting, unspecified: Secondary | ICD-10-CM | POA: Diagnosis not present

## 2017-12-24 DIAGNOSIS — K922 Gastrointestinal hemorrhage, unspecified: Secondary | ICD-10-CM

## 2017-12-24 DIAGNOSIS — K910 Vomiting following gastrointestinal surgery: Principal | ICD-10-CM | POA: Diagnosis present

## 2017-12-24 DIAGNOSIS — Z09 Encounter for follow-up examination after completed treatment for conditions other than malignant neoplasm: Secondary | ICD-10-CM

## 2017-12-24 DIAGNOSIS — Z9889 Other specified postprocedural states: Secondary | ICD-10-CM | POA: Diagnosis present

## 2017-12-24 DIAGNOSIS — Z8249 Family history of ischemic heart disease and other diseases of the circulatory system: Secondary | ICD-10-CM

## 2017-12-24 DIAGNOSIS — Z6833 Body mass index (BMI) 33.0-33.9, adult: Secondary | ICD-10-CM

## 2017-12-24 DIAGNOSIS — R1110 Vomiting, unspecified: Secondary | ICD-10-CM

## 2017-12-24 DIAGNOSIS — Z825 Family history of asthma and other chronic lower respiratory diseases: Secondary | ICD-10-CM

## 2017-12-24 DIAGNOSIS — K219 Gastro-esophageal reflux disease without esophagitis: Secondary | ICD-10-CM | POA: Diagnosis present

## 2017-12-24 DIAGNOSIS — I10 Essential (primary) hypertension: Secondary | ICD-10-CM | POA: Diagnosis present

## 2017-12-24 DIAGNOSIS — Z79891 Long term (current) use of opiate analgesic: Secondary | ICD-10-CM

## 2017-12-24 DIAGNOSIS — Z7982 Long term (current) use of aspirin: Secondary | ICD-10-CM

## 2017-12-24 DIAGNOSIS — E669 Obesity, unspecified: Secondary | ICD-10-CM | POA: Diagnosis present

## 2017-12-24 DIAGNOSIS — K311 Adult hypertrophic pyloric stenosis: Secondary | ICD-10-CM

## 2017-12-24 DIAGNOSIS — Z79899 Other long term (current) drug therapy: Secondary | ICD-10-CM

## 2017-12-24 LAB — CBC WITH DIFFERENTIAL/PLATELET
Abs Immature Granulocytes: 0.5 10*3/uL — ABNORMAL HIGH (ref 0.00–0.07)
BASOS PCT: 1 %
Basophils Absolute: 0.1 10*3/uL (ref 0.0–0.1)
EOS ABS: 0.8 10*3/uL — AB (ref 0.0–0.5)
Eosinophils Relative: 7 %
HCT: 34.1 % — ABNORMAL LOW (ref 36.0–46.0)
Hemoglobin: 11.1 g/dL — ABNORMAL LOW (ref 12.0–15.0)
Immature Granulocytes: 4 %
Lymphocytes Relative: 11 %
Lymphs Abs: 1.3 10*3/uL (ref 0.7–4.0)
MCH: 29.4 pg (ref 26.0–34.0)
MCHC: 32.6 g/dL (ref 30.0–36.0)
MCV: 90.2 fL (ref 80.0–100.0)
MONO ABS: 0.8 10*3/uL (ref 0.1–1.0)
MONOS PCT: 7 %
NEUTROS PCT: 70 %
Neutro Abs: 8.5 10*3/uL — ABNORMAL HIGH (ref 1.7–7.7)
PLATELETS: 311 10*3/uL (ref 150–400)
RBC: 3.78 MIL/uL — ABNORMAL LOW (ref 3.87–5.11)
RDW: 15.7 % — AB (ref 11.5–15.5)
WBC: 12 10*3/uL — ABNORMAL HIGH (ref 4.0–10.5)
nRBC: 0.3 % — ABNORMAL HIGH (ref 0.0–0.2)

## 2017-12-24 LAB — COMPREHENSIVE METABOLIC PANEL
ALT: 95 U/L — ABNORMAL HIGH (ref 0–44)
AST: 72 U/L — ABNORMAL HIGH (ref 15–41)
Albumin: 3.8 g/dL (ref 3.5–5.0)
Alkaline Phosphatase: 129 U/L — ABNORMAL HIGH (ref 38–126)
Anion gap: 16 — ABNORMAL HIGH (ref 5–15)
BUN: 18 mg/dL (ref 8–23)
CHLORIDE: 99 mmol/L (ref 98–111)
CO2: 21 mmol/L — ABNORMAL LOW (ref 22–32)
CREATININE: 0.75 mg/dL (ref 0.44–1.00)
Calcium: 8.4 mg/dL — ABNORMAL LOW (ref 8.9–10.3)
Glucose, Bld: 108 mg/dL — ABNORMAL HIGH (ref 70–99)
POTASSIUM: 3.6 mmol/L (ref 3.5–5.1)
Sodium: 136 mmol/L (ref 135–145)
Total Bilirubin: 1.3 mg/dL — ABNORMAL HIGH (ref 0.3–1.2)
Total Protein: 7.5 g/dL (ref 6.5–8.1)

## 2017-12-24 LAB — URINALYSIS, COMPLETE (UACMP) WITH MICROSCOPIC
Bilirubin Urine: NEGATIVE
GLUCOSE, UA: NEGATIVE mg/dL
HGB URINE DIPSTICK: NEGATIVE
Ketones, ur: 80 mg/dL — AB
LEUKOCYTES UA: NEGATIVE
NITRITE: NEGATIVE
PH: 5 (ref 5.0–8.0)
PROTEIN: 100 mg/dL — AB
Specific Gravity, Urine: 1.025 (ref 1.005–1.030)

## 2017-12-24 LAB — GASTRIC OCCULT BLOOD (1-CARD TO LAB): pH, Gastric: 5

## 2017-12-24 LAB — POCT GASTRIC OCCULT BLOOD (1-CARD TO LAB): OCCULT BLOOD, GASTRIC: POSITIVE — AB

## 2017-12-24 LAB — TROPONIN I: Troponin I: <0.03 ng/mL (ref <0.03)

## 2017-12-24 LAB — LIPASE, BLOOD: LIPASE: 18 U/L (ref 11–51)

## 2017-12-24 MED ORDER — SODIUM CHLORIDE 0.9 % IV SOLN
INTRAVENOUS | Status: DC
  Administered 2017-12-24 – 2017-12-27 (×9): via INTRAVENOUS

## 2017-12-24 MED ORDER — METOCLOPRAMIDE HCL 10 MG PO TABS: 10.0000 mg | ORAL_TABLET | Freq: Three times a day (TID) | ORAL | 0 refills | Status: DC | PRN

## 2017-12-24 MED ORDER — DIATRIZOATE MEGLUMINE & SODIUM 66-10 % PO SOLN
30.0000 mL | Freq: Once | ORAL | Status: AC
  Administered 2017-12-24: 30 mL via ORAL

## 2017-12-24 MED ORDER — PANTOPRAZOLE SODIUM 40 MG IV SOLR: 40.0000 mg | Freq: Two times a day (BID) | INTRAVENOUS | Status: DC

## 2017-12-24 MED ORDER — ENOXAPARIN SODIUM 40 MG/0.4ML ~~LOC~~ SOLN
40.0000 mg | SUBCUTANEOUS | Status: DC
  Administered 2017-12-24 – 2017-12-27 (×4): 40 mg via SUBCUTANEOUS
  Filled 2017-12-24 (×4): qty 0.4

## 2017-12-24 MED ORDER — ONDANSETRON HCL 4 MG/2ML IJ SOLN
4.0000 mg | Freq: Once | INTRAMUSCULAR | Status: AC
  Administered 2017-12-24: 4 mg via INTRAVENOUS
  Filled 2017-12-24: qty 2

## 2017-12-24 MED ORDER — SODIUM CHLORIDE 0.9 % IV SOLN
80.0000 mg | Freq: Once | INTRAVENOUS | Status: AC
  Administered 2017-12-24: 80 mg via INTRAVENOUS
  Filled 2017-12-24: qty 80

## 2017-12-24 MED ORDER — SODIUM CHLORIDE 0.9 % IV SOLN
Freq: Once | INTRAVENOUS | Status: AC
  Administered 2017-12-24: 18:00:00 via INTRAVENOUS

## 2017-12-24 MED ORDER — ONDANSETRON 4 MG PO TBDP
4.0000 mg | ORAL_TABLET | Freq: Four times a day (QID) | ORAL | Status: DC | PRN
  Filled 2017-12-24: qty 1

## 2017-12-24 MED ORDER — SODIUM CHLORIDE 0.9 % IV SOLN
8.0000 mg/h | INTRAVENOUS | Status: DC
  Filled 2017-12-24: qty 80

## 2017-12-24 MED ORDER — ONDANSETRON HCL 4 MG/2ML IJ SOLN
4.0000 mg | Freq: Four times a day (QID) | INTRAMUSCULAR | Status: DC | PRN
  Administered 2017-12-25: 4 mg via INTRAVENOUS
  Filled 2017-12-24: qty 2

## 2017-12-24 MED ORDER — ONDANSETRON 8 MG PO TBDP: 8.0000 mg | ORAL_TABLET | Freq: Three times a day (TID) | ORAL | 0 refills | Status: DC | PRN

## 2017-12-24 NOTE — ED Notes (Signed)
Report off to donna rn 

## 2017-12-24 NOTE — Patient Instructions (Addendum)
   Please pick up your medications at the pharmacy.  Drink some Gatorade 30 minutes after taking the above medications. Please go to the emergency room for IV fluids.

## 2017-12-24 NOTE — H&P (Signed)
SURGICAL HISTORY AND PHYSICAL NOTE   HISTORY OF PRESENT ILLNESS (HPI):  62 y.o. female presented to [facility] ED for evaluation of nausea and vomiting. Patient reports she started with nausea and vomiting since yesterday. Patient had recent hiatal hernia repair and incisional hernia repair. She was discharged 3 days ago. After discharge patient refers she was feeling well tolerating diet, but yesterday was not able to tolerate diet. Denies abdominal pain. No pain radiation. Nothing aggravates or improves pain. Patient vomiting has been dark. Denies fever or chills. Refers passing gas. At the moment of this evaluation, which was after the upper GI series, patient refers is not nauseous maybe due to anti emetics. Patient does not has appetite at this moment but wants ice chips.   Surgery is consulted by Dr. Pershing Proud in this context for evaluation and management of post operative nausea and vomiting.  PAST MEDICAL HISTORY (PMH):  Past Medical History:  Diagnosis Date  . Complication of anesthesia   . GERD (gastroesophageal reflux disease)   . History of hiatal hernia   . Hypertension   . PONV (postoperative nausea and vomiting)    dry heaves in 2011 only     PAST SURGICAL HISTORY [facility]):  Past Surgical History:  Procedure Laterality Date  . BREAST BIOPSY Left 1990's   benign  . CATARACT EXTRACTION, BILATERAL  09/2017  . HERNIA REPAIR  2010   hiatal, incisional  . ROBOTIC ASSISTED LAPAROSCOPIC REPAIR OF PARAESOPHAGEAL HERNIA N/A 12/18/2017   Procedure: ATTEMPTED ROBOTIC ASSISTED LAPAROSCOPIC REPAIR OF PARAESOPHAGEAL HERNIA;  Surgeon: Leafy Ro, MD;  Location: [facility] ORS;  Service: General;  Laterality: N/A;  . TONSILLECTOMY     age 33  . VENTRAL HERNIA REPAIR  12/18/2017   Procedure: HERNIA REPAIR VENTRAL ADULT;  Surgeon: Leafy Ro, MD;  Location: [facility] ORS;  Service: General;;     MEDICATIONS:  Prior to Admission medications   Medication Sig Start Date End Date Taking? Authorizing  Provider  amLODipine (NORVASC) 5 MG tablet Take 5 mg by mouth every morning.  08/31/16  Yes [provider]  latanoprost (XALATAN) 0.005 % ophthalmic solution Place 1 drop into both eyes at bedtime.    Yes [provider]  metoCLOPramide (REGLAN) 10 MG tablet Take 1 tablet (10 mg total) by mouth every 8 (eight) hours as needed for nausea. 12/24/17  Yes Pabon, Diego F, MD  metoprolol succinate (TOPROL-XL) 25 MG 24 hr tablet Take 25 mg by mouth at bedtime.  08/31/16  Yes [provider]  ondansetron (ZOFRAN ODT) 8 MG disintegrating tablet Take 1 tablet (8 mg total) by mouth every 8 (eight) hours as needed for nausea or vomiting. 12/24/17  Yes Pabon, Diego F, MD  oxyCODONE (OXY IR/ROXICODONE) 5 MG immediate release tablet Take 1 tablet (5 mg total) by mouth every 4 (four) hours as needed for severe pain or breakthrough pain. 12/21/17  Yes Donovan Kail, PA-C  aspirin EC 81 MG tablet Take 81 mg by mouth daily.    [provider]  azithromycin (ZITHROMAX Z-PAK) 250 MG tablet As directed Patient not taking: Reported on 12/04/2017 11/21/17   Pabon, Cecelia Byars F, MD  Cyanocobalamin (B-12) 1000 MCG CAPS Take 1,000 mcg by mouth daily.     [provider]  Multiple Vitamin (MULTI-VITAMINS) TABS Take 1 tablet by mouth 3 (three) times a week.     [provider]  Multiple Vitamins-Minerals (ICAPS AREDS 2 PO) Take 1 tablet by mouth 2 (two) times daily.  [provider]  Omega-3 Fatty Acids (FISH OIL) 1200 MG CAPS Take 1,200 mg by mouth daily.     [provider]     ALLERGIES:  No Known Allergies   SOCIAL HISTORY:  Social History   Socioeconomic History  . Marital status: Single    Spouse name: Not on file  . Number of children: Not on file  . Years of education: Not on file  . Highest education level: Not on file  Occupational History  . Not on file  Social Needs  . Financial resource strain: Not on file  . Food insecurity:     Worry: Not on file    Inability: Not on file  . Transportation needs:    Medical: Not on file    Non-medical: Not on file  Tobacco Use  . Smoking status: Never Smoker  . Smokeless tobacco: Never Used  Substance and Sexual Activity  . Alcohol use: Never    Frequency: Never  . Drug use: Never  . Sexual activity: Not on file  Lifestyle  . Physical activity:    Days per week: Not on file    Minutes per session: Not on file  . Stress: Not on file  Relationships  . Social connections:    Talks on phone: Not on file    Gets together: Not on file    Attends religious service: Not on file    Active member of club or organization: Not on file    Attends meetings of clubs or organizations: Not on file    Relationship status: Not on file  . Intimate partner violence:    Fear of current or ex partner: Not on file    Emotionally abused: Not on file    Physically abused: Not on file    Forced sexual activity: Not on file  Other Topics Concern  . Not on file  Social History Narrative  . Not on file     FAMILY HISTORY:  Family History  Problem Relation Age of Onset  . COPD Mother   . Cancer Mother        not sure what type  . Hypertension Mother   . COPD Father   . Heart attack Brother   . High blood pressure Brother   . Breast cancer Neg Hx      REVIEW OF SYSTEMS:  Constitutional: denies weight loss, fever, chills, or sweats  Eyes: denies any other vision changes, history of eye injury  ENT: denies sore throat, hearing problems  Respiratory: denies shortness of breath, wheezing  Cardiovascular: denies chest pain, palpitations  Gastrointestinal: denies abdominal pain, positive for nausea and vomiting. Genitourinary: denies burning with urination or urinary frequency Musculoskeletal: denies any other joint pains or cramps  Skin: denies any other rashes or skin discolorations  Neurological: denies any other headache, dizziness, weakness  Psychiatric: denies any other  depression, anxiety   All other review of systems were negative   VITAL SIGNS:  Temp:  [97.9 F (36.6 C)-98.7 F (37.1 C)] 98.7 F (37.1 C) (11/11 1515) Pulse Rate:  [87-95] 89 (11/11 2200) Resp:  [16-18] 16 (11/11 2200) BP: (150-166)/(75-97) 153/75 (11/11 2200) SpO2:  [95 %-98 %] 97 % (11/11 2200) Weight:  [95.7 kg] 95.7 kg (11/11 1516)     Height: 5\' 7"  (170.2 cm) Weight: 95.7 kg BMI (Calculated): 33.04   INTAKE/OUTPUT:  This shift: Total I/O In: 600 [I.V.:500; IV Piggyback:100] Out: -   Last 2 shifts: @IOLAST2SHIFTS @   PHYSICAL  EXAM:  Constitutional:  -- Normal body habitus  -- Awake, alert, and oriented x3  Eyes:  -- Pupils equally round and reactive to light  -- No scleral icterus  Ear, nose, and throat:  -- No jugular venous distension  Pulmonary:  -- No crackles  -- Equal breath sounds bilaterally -- Breathing non-labored at rest Cardiovascular:  -- S1, S2 present  -- No pericardial rubs Gastrointestinal:  -- Abdomen soft, nontender, non-distended, no guarding or rebound tenderness -- Wound dry and clean Musculoskeletal and Integumentary:  -- Extremities: no edema  Neurologic:  -- Motor function: intact and symmetric -- Sensation: intact and symmetric   Labs:  CBC Latest Ref Rng & Units 12/24/2017 12/19/2017 12/18/2017  WBC 4.0 - 10.5 K/uL 12.0(H) 11.5(H) 15.4(H)  Hemoglobin 12.0 - 15.0 g/dL 11.1(L) 10.8(L) 11.7(L)  Hematocrit 36.0 - 46.0 % 34.1(L) 33.4(L) 35.7(L)  Platelets 150 - 400 K/uL 311 173 176   CMP Latest Ref Rng & Units 12/24/2017 12/19/2017 12/18/2017  Glucose 70 - 99 mg/dL 161(W) 960(A) -  BUN 8 - 23 mg/dL 18 14 -  Creatinine 5.40 - 1.00 mg/dL 9.81 1.91 4.78  Sodium 135 - 145 mmol/L 136 141 -  Potassium 3.5 - 5.1 mmol/L 3.6 3.8 -  Chloride 98 - 111 mmol/L 99 106 -  CO2 22 - 32 mmol/L 21(L) 26 -  Calcium 8.9 - 10.3 mg/dL 2.9(F) 8.3(L) -  Total Protein 6.5 - 8.1 g/dL 7.5 - -  Total Bilirubin 0.3 - 1.2 mg/dL 6.2(Z) - -  Alkaline Phos 38 -  126 U/L 129(H) - -  AST 15 - 41 U/L 72(H) - -  ALT 0 - 44 U/L 95(H) - -    Imaging studies:  EXAM: WATER SOLUBLE UPPER GI SERIES  TECHNIQUE: Single-column upper GI series was performed using water soluble contrast.  CONTRAST:  30 mL Gastrografin  COMPARISON:  Abdominal radiographs 12/24/2017. CT abdomen and pelvis 09/26/2017.  FLUOROSCOPY TIME:  Fluoroscopy Time:  2 minutes  Number of Acquired Spot Images: 2  FINDINGS: The patient was able to take several small sips of water-soluble contrast material. Contrast passed from the esophagus into the stomach without a leak identified. The stomach projected over the left lower chest with a kinked appearance of the distal esophagus, and it was not clear if the stomach was located below an elevated hemidiaphragm or if there was a recurrent hernia. A moderate-sized air-fluid level was present in the stomach prior to beginning the examination and remained throughout. No contrast was seen to empty from the stomach into the duodenum over nearly 15 minutes despite upright and right lateral decubitus positioning. Multiple episodes of moderate volume spontaneous gastroesophageal reflux were observed.  IMPRESSION: 1. No passage of contrast from the stomach concerning for gastric outlet obstruction. No evidence of leak. 2. Multiple episodes of spontaneous gastroesophageal reflux. 3. Abnormal positioning of the stomach over the left lower chest. This is favored to be secondary to marked elevation of the left diaphragm (which would be a new finding from 09/26/2017), however a recurrent hernia is not excluded. Consider chest CT for further evaluation.  Electronically Signed   By: Sebastian Ache M.D.   On: 12/24/2017 21:55  Assessment/Plan:  62 y.o. female with post operative nausea and vomiting, complicated by recent surgery for hiatal and incisional hernia repair. Patient doing well post op until yesterday that started with  nausea and vomiting. Initial studies has ruled out perforation or leak. Contrast did not passed stomach but it was too  soon to call it gastric outlet obstruction. Patient at this moment is without nausea. Will let take ice chips and follow clinically. May consider repeat xray in the morning to see if contrast passes to small bowel or starting clear liquids.   Patient oriented about studies finding and the rationale of admitting her to observation until we can be sure that she will stay hydrated orally and tolerates diet without nausea.   Patient refers understood and agreed.   Gae Gallop, MD

## 2017-12-24 NOTE — Telephone Encounter (Signed)
Spoke with patient at this time. States she is vomiting, gushes at a time. Fever yesterday of 100. No fever or chills today. Throat burns.  She is eating the food items on the menu and did fine Saturday however everything she tries to eat since will not stay down. She had a bowel movement.

## 2017-12-24 NOTE — Progress Notes (Signed)
S/p open Recurrent paraesophageal H repair POD# 6 Did very well until Sunday Started throwing up yesterday, was drinking and tolerating scramble eggs. UNable to keep liquids now Low grade temp Saturday Ambulating w/o issues Showering daily  PE NAD AVSS, HR 90 Chest: CTA, s1`,s2, no crepitus or sub q air. Abd: soft, minimal appropriate incisional tenderness, no peritonitis, no infection  A/P Dehydration and intractable nausea and vomiting I do think that she will need fluids IV I have prescribed zofran and reglan for nausea control I will send her to the ER for further w/u to include lab work and CXR Low probability of esophageal injury but I rather be cautious and make sure she is ok We will d/w ER MD D/w ER charge Nurse

## 2017-12-24 NOTE — ED Notes (Signed)
Pt went to X-Ray.  

## 2017-12-24 NOTE — ED Notes (Signed)
Pt reports she had hiatal hernia repair last week and now has n/v.  Pt saw her surgeon today and was told to come to er for iv fluids.  No n/v presently.  Iv fluids infusing.  Family with pt.  No redness or drainage at incision.  Pt alert.

## 2017-12-24 NOTE — ED Triage Notes (Signed)
Patient presents to the ED post hernia repair surgery on the 5th.  Patient reports severe nausea and vomiting.  Patient reports vomiting 8-10 times in 24 hours.  Patient states everything she eats or drinks comes back up within about 10 min.  Patient states she was feeling fine on Saturday but yesterday and today have been very uncomfortable.  Patient states she contacted her surgeon who suggested she come to the ED.

## 2017-12-24 NOTE — Telephone Encounter (Signed)
Spoke with Dr.Pabon and he would like to see patient in office now.  Patient is en route to the office.

## 2017-12-24 NOTE — Telephone Encounter (Signed)
Patient's sister-in-law has called.  Patient had surgery with Dr Everlene Farrier on 12/18/17-attempted robotic paraesophageal hernia repair-turned into open with Nissen fundoplication and open ventral hernia repair.   Patient was discharged on 12/21/17.  Patient had fever of 101 on 12/22/17-she contact Dr Everlene Farrier this day. Dr Everlene Farrier advised patient to take tylenol-fever did go away until 12/23/17 that then was 100. Patient also began on 12/23/17 with nausea and vomiting (more of a regurgitation-spit up) - this is a brown color.   Patient is drinking fluids-but comes up in mouth. Patient is urinating and had first bowel movement this morning.   Patient's sister-in-law would like to be called back to discuss if the patient needs to be seen. Please call her at [phone]-Traci.

## 2017-12-24 NOTE — ED Provider Notes (Signed)
[facility] Emergency Department Provider Note  ___________________________________________   First MD Initiated Contact with Patient 12/24/17 1758     (approximate)  I have reviewed the triage vital signs and the nursing notes.   HISTORY  Chief Complaint Post-op Problem   HPI Briana Hines is a 62 y.o. female with a history of recent hiatal hernia repair who was presented to the emergency department 24 hours of nausea and vomiting.  She says that the vomitus has been black and grainy.  Denies any diarrhea and says that she is her first bowel movement this morning.  Denies any pain to her abdomen.  Says that she is been taking Zofran as well as a PPI at home without resolution of the symptoms.  Says that she is also had a fever over the past 2 days.     Past Medical History:  Diagnosis Date  . Complication of anesthesia   . GERD (gastroesophageal reflux disease)   . History of hiatal hernia   . Hypertension   . PONV (postoperative nausea and vomiting)    dry heaves in 2011 only    Patient Active Problem List   Diagnosis Date Noted  . S/P repair of paraesophageal hernia 12/18/2017  . Hiatal hernia with gastroesophageal reflux   . CRVO (central retinal vein occlusion) 10/08/2017  . Hiatal hernia 05/20/2013  . Anemia, unspecified 05/20/2013  . History of pneumonia 05/20/2013    Past Surgical History:  Procedure Laterality Date  . BREAST BIOPSY Left 1990's   benign  . CATARACT EXTRACTION, BILATERAL  09/2017  . HERNIA REPAIR  2010   hiatal, incisional  . ROBOTIC ASSISTED LAPAROSCOPIC REPAIR OF PARAESOPHAGEAL HERNIA N/A 12/18/2017   Procedure: ATTEMPTED ROBOTIC ASSISTED LAPAROSCOPIC REPAIR OF PARAESOPHAGEAL HERNIA;  Surgeon: Leafy Ro, MD;  Location: [facility] ORS;  Service: General;  Laterality: N/A;  . TONSILLECTOMY     age 57  . VENTRAL HERNIA REPAIR  12/18/2017   Procedure: HERNIA REPAIR VENTRAL ADULT;  Surgeon: Leafy Ro, MD;  Location:  [facility] ORS;  Service: General;;    Prior to Admission medications   Medication Sig Start Date End Date Taking? Authorizing Provider  amLODipine (NORVASC) 5 MG tablet Take 5 mg by mouth every morning.  08/31/16   [provider]  aspirin EC 81 MG tablet Take 81 mg by mouth daily.    [provider]  azithromycin (ZITHROMAX Z-PAK) 250 MG tablet As directed Patient not taking: Reported on 12/04/2017 11/21/17   Pabon, Cecelia Byars F, MD  Cyanocobalamin (B-12) 1000 MCG CAPS Take 1,000 mcg by mouth daily.     [provider]  latanoprost (XALATAN) 0.005 % ophthalmic solution Place 1 drop into both eyes at bedtime.     [provider]  metoCLOPramide (REGLAN) 10 MG tablet Take 1 tablet (10 mg total) by mouth every 8 (eight) hours as needed for nausea. 12/24/17   Pabon, Diego F, MD  metoprolol succinate (TOPROL-XL) 25 MG 24 hr tablet Take 25 mg by mouth at bedtime.  08/31/16   [provider]  Multiple Vitamin (MULTI-VITAMINS) TABS Take 1 tablet by mouth 3 (three) times a week.     [provider]  Multiple Vitamins-Minerals (ICAPS AREDS 2 PO) Take 1 tablet by mouth 2 (two) times daily.     [provider]  Omega-3 Fatty Acids (FISH OIL) 1200 MG CAPS Take 1,200 mg by mouth daily.     [provider]  ondansetron (ZOFRAN ODT) 8  MG disintegrating tablet Take 1 tablet (8 mg total) by mouth every 8 (eight) hours as needed for nausea or vomiting. 12/24/17   Pabon, Diego F, MD  oxyCODONE (OXY IR/ROXICODONE) 5 MG immediate release tablet Take 1 tablet (5 mg total) by mouth every 4 (four) hours as needed for severe pain or breakthrough pain. 12/21/17   Donovan Kail, PA-C    Allergies Patient has no known allergies.  Family History  Problem Relation Age of Onset  . COPD Mother   . Cancer Mother        not sure what type  . Hypertension Mother   . COPD Father   . Heart attack Brother   . High blood pressure Brother   . Breast cancer Neg Hx      Social History Social History   Tobacco Use  . Smoking status: Never Smoker  . Smokeless tobacco: Never Used  Substance Use Topics  . Alcohol use: Never    Frequency: Never  . Drug use: Never    Review of Systems  Constitutional: No fever/chills Eyes: No visual changes. ENT: No sore throat. Cardiovascular: Denies chest pain. Respiratory: Denies shortness of breath. Gastrointestinal: No abdominal pain.  No diarrhea.  No constipation. Genitourinary: Negative for dysuria. Musculoskeletal: Negative for back pain. Skin: Negative for rash. Neurological: Negative for headaches, focal weakness or numbness.   ____________________________________________   PHYSICAL EXAM:  VITAL SIGNS: ED Triage Vitals  Enc Vitals Group     BP 12/24/17 1515 (!) 150/85     Pulse Rate 12/24/17 1515 91     Resp 12/24/17 1515 18     Temp 12/24/17 1515 98.7 F (37.1 C)     Temp Source 12/24/17 1515 Oral     SpO2 12/24/17 1515 95 %     Weight 12/24/17 1516 211 lb (95.7 kg)     Height 12/24/17 1516 5\' 7"  (1.702 m)     Head Circumference --      Peak Flow --      Pain Score 12/24/17 1516 7     Pain Loc --      Pain Edu? --      Excl. in GC? --     Constitutional: Alert and oriented. Dark gray/black  Eyes: Conjunctivae are normal.  Head: Atraumatic. Nose: No congestion/rhinnorhea. Mouth/Throat: Mucous membranes are moist.  Neck: No stridor.   Cardiovascular: Normal rate, regular rhythm. Grossly normal heart sounds.  Respiratory: Normal respiratory effort.  No retractions. Lungs CTAB. Gastrointestinal: Soft and nontender. No distention.  Incisions with staples intact without any dehiscence, tenderness to palpation, exudate, induration or fluctuance. Musculoskeletal: No lower extremity tenderness nor edema.  No joint effusions. Neurologic:  Normal speech and language. No gross focal neurologic deficits are appreciated. Skin:  Skin is warm, dry and intact. No rash noted. Psychiatric:  Mood and affect are normal. Speech and behavior are normal.  ____________________________________________   LABS (all labs ordered are listed, but only abnormal results are displayed)  Labs Reviewed  CBC WITH DIFFERENTIAL/PLATELET - Abnormal; Notable for the following components:      Result Value   WBC 12.0 (*)    RBC 3.78 (*)    Hemoglobin 11.1 (*)    HCT 34.1 (*)    RDW 15.7 (*)    nRBC 0.3 (*)    Neutro Abs 8.5 (*)    Eosinophils Absolute 0.8 (*)    Abs Immature Granulocytes 0.50 (*)    All other components within normal limits  COMPREHENSIVE  METABOLIC PANEL - Abnormal; Notable for the following components:   CO2 21 (*)    Glucose, Bld 108 (*)    Calcium 8.4 (*)    AST 72 (*)    ALT 95 (*)    Alkaline Phosphatase 129 (*)    Total Bilirubin 1.3 (*)    Anion gap 16 (*)    All other components within normal limits  URINALYSIS, COMPLETE (UACMP) WITH MICROSCOPIC - Abnormal; Notable for the following components:   Color, Urine AMBER (*)    APPearance CLEAR (*)    Ketones, ur 80 (*)    Protein, ur 100 (*)    Bacteria, UA RARE (*)    All other components within normal limits  POCT GASTRIC OCCULT BLOOD (1-CARD TO LAB) - Abnormal; Notable for the following components:   Occult Blood, Gastric POSITIVE (*)    All other components within normal limits  LIPASE, BLOOD  TROPONIN I   ____________________________________________  EKG   ____________________________________________  RADIOLOGY  Abdominal series with chest x-ray without acute process. ____________________________________________   PROCEDURES  Procedure(s) performed:   .Critical Care Performed by: Myrna Blazer, MD Authorized by: Myrna Blazer, MD   Critical care provider statement:    Critical care time (minutes):  35   Critical care time was exclusive of:  Separately billable procedures and treating other patients   Critical care was necessary to treat or prevent imminent or  life-threatening deterioration of the following conditions: gi bleeding.   Critical care was time spent personally by me on the following activities:  Development of treatment plan with patient or surrogate, discussions with consultants, evaluation of patient's response to treatment, examination of patient, obtaining history from patient or surrogate, ordering and performing treatments and interventions, ordering and review of laboratory studies, ordering and review of radiographic studies, pulse oximetry, re-evaluation of patient's condition and review of old charts    Critical Care performed:    ____________________________________________   INITIAL IMPRESSION / ASSESSMENT AND PLAN / ED COURSE  Pertinent labs & imaging results that were available during my care of the patient were reviewed by me and considered in my medical decision making (see chart for details).  Differential diagnosis includes, but is not limited to, biliary disease (biliary colic, acute cholecystitis, cholangitis, choledocholithiasis, etc), intrathoracic causes for epigastric abdominal pain including ACS, gastritis, duodenitis, pancreatitis, small bowel or large bowel obstruction, abdominal aortic aneurysm, hernia, and ulcer(s). As part of my medical decision making, I reviewed the following data within the electronic MEDICAL RECORD NUMBERNotes from recent procedure  ----------------------------------------- 8:37 PM on 12/24/2017 -----------------------------------------  Patient is Gaskill positive.  Discussed the case with Dr. Maia Plan.  Will be starting a Protonix drip.  Patient to be admitted to the hospital.  Dr. Maia Plan requesting an upper GI series.  Patient aware of diagnosis as well as treatment and willing to comply.  Dr. Maia Plan in surgery right now.  However, he will be down to evaluate the patient once completed. ____________________________________________   FINAL CLINICAL IMPRESSION(S) / ED  DIAGNOSES  Upper GI bleeding.  NEW MEDICATIONS STARTED DURING THIS VISIT:  New Prescriptions   No medications on file     Note:  This document was prepared using Dragon voice recognition software and may include unintentional dictation errors.     Myrna Blazer, MD 12/24/17 2037

## 2017-12-25 ENCOUNTER — Observation Stay: Payer: Managed Care, Other (non HMO)

## 2017-12-25 ENCOUNTER — Other Ambulatory Visit: Payer: Self-pay

## 2017-12-25 MED ORDER — LATANOPROST 0.005 % OP SOLN
1.0000 [drp] | Freq: Every day | OPHTHALMIC | Status: DC
  Administered 2017-12-25 – 2017-12-27 (×3): 1 [drp] via OPHTHALMIC
  Filled 2017-12-25: qty 2.5

## 2017-12-25 MED ORDER — ACETAMINOPHEN 325 MG PO TABS
650.0000 mg | ORAL_TABLET | Freq: Four times a day (QID) | ORAL | Status: DC | PRN
  Administered 2017-12-25 – 2017-12-26 (×2): 650 mg via ORAL
  Filled 2017-12-25 (×2): qty 2

## 2017-12-25 MED ORDER — METOPROLOL SUCCINATE ER 25 MG PO TB24
25.0000 mg | ORAL_TABLET | Freq: Every day | ORAL | Status: DC
  Administered 2017-12-25 – 2017-12-28 (×4): 25 mg via ORAL
  Filled 2017-12-25 (×4): qty 1

## 2017-12-25 MED ORDER — AMLODIPINE BESYLATE 5 MG PO TABS
5.0000 mg | ORAL_TABLET | Freq: Every day | ORAL | Status: DC
  Administered 2017-12-25 – 2017-12-28 (×4): 5 mg via ORAL
  Filled 2017-12-25 (×4): qty 1

## 2017-12-25 MED ORDER — METOCLOPRAMIDE HCL 10 MG PO TABS
5.0000 mg | ORAL_TABLET | Freq: Three times a day (TID) | ORAL | Status: DC | PRN
  Administered 2017-12-25 – 2017-12-26 (×3): 5 mg via ORAL
  Filled 2017-12-25 (×3): qty 1

## 2017-12-25 NOTE — Progress Notes (Signed)
SURGICAL PROGRESS NOTE   Hospital Day(s): 0.   Post op day(s):  Marland Kitchen   Interval History: Patient seen and examined, no acute events or new complaints overnight. Patient reports still with vomiting episodes. Refers heartburn has been improving with Protonix.   Vital signs in last 24 hours: [min-max] current  Temp:  [97.6 F (36.4 C)-98 F (36.7 C)] 98 F (36.7 C) (11/12 1156) Pulse Rate:  [83-97] 88 (11/12 1156) Resp:  [16-18] 18 (11/12 1156) BP: (139-163)/(73-90) 142/78 (11/12 1156) SpO2:  [94 %-98 %] 96 % (11/12 1156) Weight:  [96.4 kg] 96.4 kg (11/12 0145)     Height: 5\' 7"  (170.2 cm) Weight: 96.4 kg BMI (Calculated): 33.27    Physical Exam:  Constitutional: alert, cooperative and no distress  Respiratory: breathing non-labored at rest  Cardiovascular: regular rate and sinus rhythm  Gastrointestinal: soft, non-tender, and non-distended. Wound dry and clean.   Labs:  CBC Latest Ref Rng & Units 12/24/2017 12/19/2017 12/18/2017  WBC 4.0 - 10.5 K/uL 12.0(H) 11.5(H) 15.4(H)  Hemoglobin 12.0 - 15.0 g/dL 11.1(L) 10.8(L) 11.7(L)  Hematocrit 36.0 - 46.0 % 34.1(L) 33.4(L) 35.7(L)  Platelets 150 - 400 K/uL 311 173 176   CMP Latest Ref Rng & Units 12/24/2017 12/19/2017 12/18/2017  Glucose 70 - 99 mg/dL 161(W) 960(A) -  BUN 8 - 23 mg/dL 18 14 -  Creatinine 5.40 - 1.00 mg/dL 9.81 1.91 4.78  Sodium 135 - 145 mmol/L 136 141 -  Potassium 3.5 - 5.1 mmol/L 3.6 3.8 -  Chloride 98 - 111 mmol/L 99 106 -  CO2 22 - 32 mmol/L 21(L) 26 -  Calcium 8.9 - 10.3 mg/dL 2.9(F) 8.3(L) -  Total Protein 6.5 - 8.1 g/dL 7.5 - -  Total Bilirubin 0.3 - 1.2 mg/dL 6.2(Z) - -  Alkaline Phos 38 - 126 U/L 129(H) - -  AST 15 - 41 U/L 72(H) - -  ALT 0 - 44 U/L 95(H) - -    Imaging studies:  EXAM: WATER SOLUBLE UPPER GI SERIES  TECHNIQUE: Single-column upper GI series was performed using water soluble contrast.  CONTRAST:  30 mL Gastrografin  COMPARISON:  Abdominal radiographs 12/24/2017. CT abdomen and  pelvis 09/26/2017.  FLUOROSCOPY TIME:  Fluoroscopy Time:  2 minutes  Number of Acquired Spot Images: 2  FINDINGS: The patient was able to take several small sips of water-soluble contrast material. Contrast passed from the esophagus into the stomach without a leak identified. The stomach projected over the left lower chest with a kinked appearance of the distal esophagus, and it was not clear if the stomach was located below an elevated hemidiaphragm or if there was a recurrent hernia. A moderate-sized air-fluid level was present in the stomach prior to beginning the examination and remained throughout. No contrast was seen to empty from the stomach into the duodenum over nearly 15 minutes despite upright and right lateral decubitus positioning. Multiple episodes of moderate volume spontaneous gastroesophageal reflux were observed.  IMPRESSION: 1. No passage of contrast from the stomach concerning for gastric outlet obstruction. No evidence of leak. 2. Multiple episodes of spontaneous gastroesophageal reflux. 3. Abnormal positioning of the stomach over the left lower chest. This is favored to be secondary to marked elevation of the left diaphragm (which would be a new finding from 09/26/2017), however a recurrent hernia is not excluded. Consider chest CT for further evaluation.   Electronically Signed   By: Sebastian Ache M.D.   On: 12/24/2017 21:55  Assessment/Plan:  62 y.o. Female status  post paraesophageal H repair and incisional hernia repair with mesh, POD#7. Patient with post op nausea and vomiting. Leak, perforation and obstruction has been ruled out. Patient most likely suffering from Gastroparesis or edema that is causing post op vomiting. Today unable to tolerate clear good enough to maintain hydration without IV fluids. Patient started on Reglan and Protonix. Reglan started today. Will try to see if this help to improve the vomiting and that she can tolerate liquid  diet.   Today's xray the contrast is in the large intestine so obstruction is not a concern.    Gae GallopEdgardo Cintrn-Daz, MD

## 2017-12-26 DIAGNOSIS — Z9889 Other specified postprocedural states: Secondary | ICD-10-CM | POA: Diagnosis not present

## 2017-12-26 DIAGNOSIS — Z8249 Family history of ischemic heart disease and other diseases of the circulatory system: Secondary | ICD-10-CM | POA: Diagnosis not present

## 2017-12-26 DIAGNOSIS — K910 Vomiting following gastrointestinal surgery: Secondary | ICD-10-CM | POA: Diagnosis present

## 2017-12-26 DIAGNOSIS — Z79891 Long term (current) use of opiate analgesic: Secondary | ICD-10-CM | POA: Diagnosis not present

## 2017-12-26 DIAGNOSIS — R112 Nausea with vomiting, unspecified: Secondary | ICD-10-CM | POA: Diagnosis present

## 2017-12-26 DIAGNOSIS — E876 Hypokalemia: Secondary | ICD-10-CM | POA: Diagnosis present

## 2017-12-26 DIAGNOSIS — I10 Essential (primary) hypertension: Secondary | ICD-10-CM | POA: Diagnosis present

## 2017-12-26 DIAGNOSIS — R5082 Postprocedural fever: Secondary | ICD-10-CM | POA: Diagnosis present

## 2017-12-26 DIAGNOSIS — K219 Gastro-esophageal reflux disease without esophagitis: Secondary | ICD-10-CM | POA: Diagnosis present

## 2017-12-26 DIAGNOSIS — E669 Obesity, unspecified: Secondary | ICD-10-CM | POA: Diagnosis present

## 2017-12-26 DIAGNOSIS — Z79899 Other long term (current) drug therapy: Secondary | ICD-10-CM | POA: Diagnosis not present

## 2017-12-26 DIAGNOSIS — Z7982 Long term (current) use of aspirin: Secondary | ICD-10-CM | POA: Diagnosis not present

## 2017-12-26 DIAGNOSIS — Z825 Family history of asthma and other chronic lower respiratory diseases: Secondary | ICD-10-CM | POA: Diagnosis not present

## 2017-12-26 DIAGNOSIS — Z6833 Body mass index (BMI) 33.0-33.9, adult: Secondary | ICD-10-CM | POA: Diagnosis not present

## 2017-12-26 LAB — CBC
HCT: 30.7 % — ABNORMAL LOW (ref 36.0–46.0)
Hemoglobin: 9.7 g/dL — ABNORMAL LOW (ref 12.0–15.0)
MCH: 29.2 pg (ref 26.0–34.0)
MCHC: 31.6 g/dL (ref 30.0–36.0)
MCV: 92.5 fL (ref 80.0–100.0)
Platelets: 343 10*3/uL (ref 150–400)
RBC: 3.32 MIL/uL — ABNORMAL LOW (ref 3.87–5.11)
RDW: 15.7 % — AB (ref 11.5–15.5)
WBC: 9.9 10*3/uL (ref 4.0–10.5)
nRBC: 0 % (ref 0.0–0.2)

## 2017-12-26 LAB — BASIC METABOLIC PANEL
Anion gap: 13 (ref 5–15)
BUN: 13 mg/dL (ref 8–23)
CALCIUM: 8.4 mg/dL — AB (ref 8.9–10.3)
CO2: 21 mmol/L — ABNORMAL LOW (ref 22–32)
CREATININE: 0.56 mg/dL (ref 0.44–1.00)
Chloride: 107 mmol/L (ref 98–111)
GFR calc non Af Amer: >60 mL/min (ref >60)
Glucose, Bld: 98 mg/dL (ref 70–99)
Potassium: 3.4 mmol/L — ABNORMAL LOW (ref 3.5–5.1)
Sodium: 141 mmol/L (ref 135–145)

## 2017-12-26 MED ORDER — PROCHLORPERAZINE EDISYLATE 10 MG/2ML IJ SOLN
10.0000 mg | Freq: Four times a day (QID) | INTRAMUSCULAR | Status: DC | PRN
  Filled 2017-12-26: qty 2

## 2017-12-26 MED ORDER — PANTOPRAZOLE SODIUM 40 MG IV SOLR
40.0000 mg | Freq: Two times a day (BID) | INTRAVENOUS | Status: DC
  Administered 2017-12-26 – 2017-12-28 (×5): 40 mg via INTRAVENOUS
  Filled 2017-12-26 (×5): qty 40

## 2017-12-26 MED ORDER — AMLODIPINE BESYLATE 5 MG PO TABS: 5.0000 mg | ORAL_TABLET | ORAL | Status: DC

## 2017-12-26 MED ORDER — ONDANSETRON HCL 4 MG/2ML IJ SOLN
4.0000 mg | Freq: Four times a day (QID) | INTRAMUSCULAR | Status: DC
  Administered 2017-12-26 – 2017-12-28 (×8): 4 mg via INTRAVENOUS
  Filled 2017-12-26 (×8): qty 2

## 2017-12-26 MED ORDER — METOPROLOL SUCCINATE ER 25 MG PO TB24: 25.0000 mg | ORAL_TABLET | Freq: Every day | ORAL | Status: DC

## 2017-12-26 MED ORDER — OXYCODONE HCL 5 MG PO TABS: 5.0000 mg | ORAL_TABLET | ORAL | Status: DC | PRN

## 2017-12-26 MED ORDER — POTASSIUM CHLORIDE 20 MEQ PO PACK
20.0000 meq | PACK | Freq: Once | ORAL | Status: AC
  Administered 2017-12-26: 20 meq via ORAL
  Filled 2017-12-26: qty 1

## 2017-12-26 MED ORDER — METOCLOPRAMIDE HCL 10 MG PO TABS
5.0000 mg | ORAL_TABLET | Freq: Three times a day (TID) | ORAL | Status: DC
  Administered 2017-12-26 – 2017-12-28 (×6): 5 mg via ORAL
  Filled 2017-12-26 (×6): qty 1

## 2017-12-26 NOTE — Progress Notes (Signed)
Patient ambulated halls this early afternoon with IV pole and did great.   Pt has had occasional emesis throughout the day but able to keep down liquids for an hour at a time.

## 2017-12-26 NOTE — Progress Notes (Signed)
[facility] Progress Note     Subjective: No acute events overnight. She continues to endorse intermittent emesis with brushing teeth and PO intake. She can tolerate small sips of PO intake at a time. No complaints of abdominal pain, fevers, or chills. Mobilizing  Objective: Vital signs in last 24 hours: Temp:  [98 F (36.7 C)-98.6 F (37 C)] 98.1 F (36.7 C) (11/13 0908) Pulse Rate:  [78-91] 85 (11/13 0908) Resp:  [17-18] 18 (11/13 0908) BP: (135-170)/(65-85) 170/85 (11/13 0908) SpO2:  [92 %-98 %] 97 % (11/13 0908) Last BM Date: 12/24/17  Intake/Output from previous day: 11/12 0701 - 11/13 0700 In: 3287.6 [P.O.:360; I.V.:2927.6] Out: -  Intake/Output this shift: Total I/O In: 240 [P.O.:240] Out: -   PE: Gen:  Alert, NAD, pleasant Pulm:  Normal effort, clear to auscultation bilaterally Abd: Soft, non-tender, non-distended, midline laparoscopic incision is CDI, some scabbing, staples in place, no erythema. Laparoscopic incisions are CDI, staples in place.  Skin: warm and dry, no rashes  Psych: A&Ox3   Lab Results:  Recent Labs    12/24/17 1519  WBC 12.0*  HGB 11.1*  HCT 34.1*  PLT 311   BMET Recent Labs    12/24/17 1519  NA 136  K 3.6  CL 99  CO2 21*  GLUCOSE 108*  BUN 18  CREATININE 0.75  CALCIUM 8.4*   PT/INR No results for input(s): LABPROT, INR in the last 72 hours. CMP     Component Value Date/Time   NA 136 12/24/2017 1519   K 3.6 12/24/2017 1519   CL 99 12/24/2017 1519   CO2 21 (L) 12/24/2017 1519   GLUCOSE 108 (H) 12/24/2017 1519   BUN 18 12/24/2017 1519   CREATININE 0.75 12/24/2017 1519   CALCIUM 8.4 (L) 12/24/2017 1519   PROT 7.5 12/24/2017 1519   ALBUMIN 3.8 12/24/2017 1519   AST 72 (H) 12/24/2017 1519   ALT 95 (H) 12/24/2017 1519   ALKPHOS 129 (H) 12/24/2017 1519   BILITOT 1.3 (H) 12/24/2017 1519   GFRNONAA >60 12/24/2017 1519   GFRAA >60 12/24/2017 1519   Lipase     Component Value Date/Time   LIPASE 18  12/24/2017 1519       Studies/Results: Dg Abd 2 Views  Result Date: 12/25/2017 CLINICAL DATA:  Nausea vomiting.  Prior hernia repairs. EXAM: ABDOMEN - 2 VIEW COMPARISON:  12/24/2017.  CT 09/26/2017. FINDINGS: Surgical staples noted over the abdomen. Soft tissue structures are unremarkable. Elevation of the left hemidiaphragm and positioning of the stomach over the left lower chest again noted. Again recurrent hernia cannot be excluded. Contrast is again noted in the stomach, again consisting suggesting gastric outlet obstruction. No bowel distention or free air. Contrast is noted in the stomach and colon IMPRESSION: Elevation left hemidiaphragm and positioning of the stomach over the left lower chest again noted. Recurrent hiatal hernia cannot be excluded. Contrast is again noted within the stomach. Again this is concerning for gastric outlet obstruction. Electronically Signed   By: Maisie Fushomas  Register   On: 12/25/2017 08:43   Dg Abdomen Acute W/chest  Result Date: 12/24/2017 CLINICAL DATA:  Nausea and vomiting.  Recent hernia repair EXAM: DG ABDOMEN ACUTE W/ 1V CHEST COMPARISON:  Chest radiograph December 29, 2003 and chest CT September 26, 2017 FINDINGS: There is significant elevation of the left hemidiaphragm with atelectatic change in the left base. Lungs elsewhere are clear. Heart size and pulmonary vascularity are normal. No adenopathy. Supine and upright abdomen: There is moderate stool  in the colon. There is a paucity of gas overall. No bowel dilatation or air-fluid level. No free air. Skin staples noted. There is a 5 x 4 mm calculus in the lower pole left kidney with a 3 x 3 mm nearby calculus in the lower pole left kidney. IMPRESSION: 1. Elevation of the left hemidiaphragm which may be of postoperative etiology. There is left base atelectasis. Lungs elsewhere are clear. 2. Relative paucity of gas noted. This finding may be seen normally but also may be seen as a consequence of ileus or enteritis.  Bowel obstruction is not felt to be present. No free air is appreciable. Skin staples noted. 3.  Calculi lower pole left kidney. Electronically Signed   By: Bretta Bang III M.D.   On: 12/24/2017 15:59   Dg Kayleen Memos W/water Sol Cm  Result Date: 12/24/2017 CLINICAL DATA:  Vomiting. Recent paraesophageal hernia repair and Nissen fundoplication. EXAM: WATER SOLUBLE UPPER GI SERIES TECHNIQUE: Single-column upper GI series was performed using water soluble contrast. CONTRAST:  30 mL Gastrografin COMPARISON:  Abdominal radiographs 12/24/2017. CT abdomen and pelvis 09/26/2017. FLUOROSCOPY TIME:  Fluoroscopy Time:  2 minutes Number of Acquired Spot Images: 2 FINDINGS: The patient was able to take several small sips of water-soluble contrast material. Contrast passed from the esophagus into the stomach without a leak identified. The stomach projected over the left lower chest with a kinked appearance of the distal esophagus, and it was not clear if the stomach was located below an elevated hemidiaphragm or if there was a recurrent hernia. A moderate-sized air-fluid level was present in the stomach prior to beginning the examination and remained throughout. No contrast was seen to empty from the stomach into the duodenum over nearly 15 minutes despite upright and right lateral decubitus positioning. Multiple episodes of moderate volume spontaneous gastroesophageal reflux were observed. IMPRESSION: 1. No passage of contrast from the stomach concerning for gastric outlet obstruction. No evidence of leak. 2. Multiple episodes of spontaneous gastroesophageal reflux. 3. Abnormal positioning of the stomach over the left lower chest. This is favored to be secondary to marked elevation of the left diaphragm (which would be a new finding from 09/26/2017), however a recurrent hernia is not excluded. Consider chest CT for further evaluation. Electronically Signed   By: Sebastian Ache M.D.   On: 12/24/2017 21:55     Anti-infectives: Anti-infectives (From admission, onward)   None       Assessment/Plan  Post-operative nausea/vomiting Briana Hines is a 62 y.o. female with post-operative nausea and emesis most likely related to gastroparesis vs post-op edema with esophageal leak being ruled out on gastrografin study 11/11 who is 8 days s/p paraesophageal hernia repair for recurrent paraesophageal hernias which is complicated by pertinent co-morbidities including HTN and obesity.    - Continue sips of clears as tolerates  - Aggressive IVF hydration  - Continue Anti-emetics, Reglan, Protonix  - Will re-check CBC and BMP this morning to monitor blood counts and electrolytes/renal function  - Mobilize as tolerates  - DVT prophylaxis  -- Lynden Oxford , PA-C [facility] 12/26/2017, 10:36 AM [phone] M-F: 7am - 4pm

## 2017-12-26 NOTE — Consult Note (Signed)
PHARMACY CONSULT NOTE - initial  Pharmacy Consult for Electrolyte Monitoring and Replacement   Recent Labs: Potassium (mmol/L)  Date Value  12/26/2017 3.4 (L)   Magnesium (mg/dL)  Date Value  16/10/960411/07/2017 1.8   Calcium (mg/dL)  Date Value  54/09/811911/13/2019 8.4 (L)   Albumin (g/dL)  Date Value  14/78/295611/12/2017 3.8   Phosphorus (mg/dL)  Date Value  21/30/865711/07/2017 3.6  ]   Assessment: Patient potassium dropping over inpatient stay due to low po intake and vomiting.  Pharmacy has been consulted for potassium replenishment and monitoring.  Goal of Therapy:  Potassium ~ 4  Plan:  Potassium chloride packets 20meq po x 1 Will monitor potassium with AM labs  Briana Hines ,PharmD Clinical Pharmacist 12/26/2017 2:32 PM

## 2017-12-27 LAB — POTASSIUM
POTASSIUM: 3.2 mmol/L — AB (ref 3.5–5.1)
POTASSIUM: 3.6 mmol/L (ref 3.5–5.1)

## 2017-12-27 MED ORDER — POTASSIUM CHLORIDE 10 MEQ/100ML IV SOLN
10.0000 meq | INTRAVENOUS | Status: AC
  Administered 2017-12-27 (×3): 10 meq via INTRAVENOUS
  Filled 2017-12-27 (×2): qty 100

## 2017-12-27 MED ORDER — POTASSIUM CHLORIDE CRYS ER 20 MEQ PO TBCR
40.0000 meq | EXTENDED_RELEASE_TABLET | Freq: Once | ORAL | Status: AC
  Administered 2017-12-27: 40 meq via ORAL
  Filled 2017-12-27: qty 2

## 2017-12-27 MED ORDER — POTASSIUM CHLORIDE 10 MEQ/100ML IV SOLN
10.0000 meq | INTRAVENOUS | Status: DC
  Administered 2017-12-27: 10 meq via INTRAVENOUS
  Filled 2017-12-27 (×2): qty 100

## 2017-12-27 NOTE — Progress Notes (Signed)
[facility] Progress Note     Subjective: She reported an episode of nausea and emesis prior to falling asleep last night, but this has resolved this morning. No complaints of abdominal pain. She has been tolerating sips of water this morning and attempting to eat breakfast. Has been mobilizing.    Objective: Vital signs in last 24 hours: Temp:  [97.5 F (36.4 C)-99 F (37.2 C)] 97.5 F (36.4 C) (11/14 0835) Pulse Rate:  [73-75] 73 (11/14 0835) Resp:  [18] 18 (11/14 0835) BP: (138-155)/(67-80) 153/80 (11/14 0835) SpO2:  [95 %-97 %] 96 % (11/14 0835) Last BM Date: 12/26/17  Intake/Output from previous day: 11/13 0701 - 11/14 0700 In: 2854.2 [P.O.:740; I.V.:2114.2] Out: 0  Intake/Output this shift: No intake/output data recorded.  PE: Gen:  Alert, NAD, pleasant Pulm:  Normal effort Abd: Soft, non-tender, non-distended Skin: warm and dry, no rashes  Psych: A&Ox3   Lab Results:  Recent Labs    12/24/17 1519 12/26/17 1150  WBC 12.0* 9.9  HGB 11.1* 9.7*  HCT 34.1* 30.7*  PLT 311 343   BMET Recent Labs    12/24/17 1519 12/26/17 1150 12/27/17 0433  NA 136 141  --   K 3.6 3.4* 3.2*  CL 99 107  --   CO2 21* 21*  --   GLUCOSE 108* 98  --   BUN 18 13  --   CREATININE 0.75 0.56  --   CALCIUM 8.4* 8.4*  --    PT/INR No results for input(s): LABPROT, INR in the last 72 hours. CMP     Component Value Date/Time   NA 141 12/26/2017 1150   K 3.2 (L) 12/27/2017 0433   CL 107 12/26/2017 1150   CO2 21 (L) 12/26/2017 1150   GLUCOSE 98 12/26/2017 1150   BUN 13 12/26/2017 1150   CREATININE 0.56 12/26/2017 1150   CALCIUM 8.4 (L) 12/26/2017 1150   PROT 7.5 12/24/2017 1519   ALBUMIN 3.8 12/24/2017 1519   AST 72 (H) 12/24/2017 1519   ALT 95 (H) 12/24/2017 1519   ALKPHOS 129 (H) 12/24/2017 1519   BILITOT 1.3 (H) 12/24/2017 1519   GFRNONAA >60 12/26/2017 1150   GFRAA >60 12/26/2017 1150   Lipase     Component Value Date/Time   LIPASE 18 12/24/2017  1519       Studies/Results: No results found.  Anti-infectives: Anti-infectives (From admission, onward)   None       Assessment/Plan  Post-operative nausea/vomiting Briana Hines is a 62 y.o. female with post-operative nausea and emesis most likely related to gastroparesis vs post-op edema with esophageal leak being ruled out on gastrografin study 11/11 as well as hypokalemia secondary to emesis and decreased PO intake who is 8 days s/p paraesophageal hernia repair for recurrent paraesophageal hernias which is complicated by pertinent co-morbidities including HTN and obesity.               - Continue sips of clears as tolerates             - IVF hydration, will begin to wean as diet advances             - Continue Anti-emetics (Zofran), Reglan, Protonix             - Hypokalemia, will replete and monitor             - Mobilize as tolerates             - DVT prophylaxis  --  Lynden OxfordZachary Kiran Lapine , PA-C [facility] 12/27/2017, 9:14 AM [phone] M-F: 7am - 4pm

## 2017-12-27 NOTE — Progress Notes (Signed)
Assessed pt for PIV.   States potassium IV is hurting during infusion. Veins small.  Reviewed interventions with pt and RN.  Agree to attempt heat/ice and infusing with NS infusion to dilute potassium.  Will attempt.  Instructed to notify IV team if still needs another PIV.

## 2017-12-27 NOTE — Consult Note (Signed)
PHARMACY CONSULT NOTE - progress  Pharmacy Consult for Electrolyte Monitoring and Replacement   Recent Labs: Potassium (mmol/L)  Date Value  12/27/2017 3.6   Magnesium (mg/dL)  Date Value  69/62/952811/07/2017 1.8   Calcium (mg/dL)  Date Value  41/32/440111/13/2019 8.4 (L)   Albumin (g/dL)  Date Value  02/72/536611/12/2017 3.8   Phosphorus (mg/dL)  Date Value  44/03/474211/07/2017 3.6  ]   Assessment: Patient potassium dropping over inpatient stay due to low po intake and vomiting.  Pharmacy has been consulted for potassium replenishment and monitoring.  Goal of Therapy:  Potassium ~ 4  Plan:  Will give potassium PO 40 mEq tonight and recheck labs in the AM.  If potassium still low, check magnesium and follow  Ronnald RampKishan S Edith Groleau ,PharmD Clinical Pharmacist 12/27/2017 7:44 PM

## 2017-12-27 NOTE — Consult Note (Addendum)
PHARMACY CONSULT NOTE - progress  Pharmacy Consult for Electrolyte Monitoring and Replacement   Recent Labs: Potassium (mmol/L)  Date Value  12/27/2017 3.2 (L)   Magnesium (mg/dL)  Date Value  82/95/621311/07/2017 1.8   Calcium (mg/dL)  Date Value  08/65/784611/13/2019 8.4 (L)   Albumin (g/dL)  Date Value  96/29/528411/12/2017 3.8   Phosphorus (mg/dL)  Date Value  13/24/401011/07/2017 3.6  ]   Assessment: Patient potassium dropping over inpatient stay due to low po intake and vomiting.  Pharmacy has been consulted for potassium replenishment and monitoring.  Goal of Therapy:  Potassium ~ 4  Plan:  Potassium Chloride IV 10meq over 1 hr x 4 Will monitor potassium @1800  If potassium still low, check magnesium and follow  Albina Billetharles M Buffie Herne ,PharmD Clinical Pharmacist 12/27/2017 7:38 AM

## 2017-12-28 LAB — BASIC METABOLIC PANEL
Anion gap: 11 (ref 5–15)
BUN: 7 mg/dL — ABNORMAL LOW (ref 8–23)
CO2: 26 mmol/L (ref 22–32)
Calcium: 8.6 mg/dL — ABNORMAL LOW (ref 8.9–10.3)
Chloride: 103 mmol/L (ref 98–111)
Creatinine, Ser: 0.68 mg/dL (ref 0.44–1.00)
GFR calc Af Amer: >60 mL/min (ref >60)
GFR calc non Af Amer: >60 mL/min (ref >60)
Glucose, Bld: 95 mg/dL (ref 70–99)
Potassium: 3.6 mmol/L (ref 3.5–5.1)
Sodium: 140 mmol/L (ref 135–145)

## 2017-12-28 MED ORDER — METOCLOPRAMIDE HCL 10 MG PO TABS: 10.0000 mg | ORAL_TABLET | Freq: Four times a day (QID) | ORAL | 0 refills | Status: DC

## 2017-12-28 MED ORDER — POTASSIUM CHLORIDE CRYS ER 20 MEQ PO TBCR: 40.0000 meq | EXTENDED_RELEASE_TABLET | Freq: Once | ORAL | Status: DC

## 2017-12-28 MED ORDER — OMEPRAZOLE 40 MG PO CPDR: 40.0000 mg | DELAYED_RELEASE_CAPSULE | Freq: Every day | ORAL | 1 refills | Status: AC

## 2017-12-28 MED ORDER — POTASSIUM CHLORIDE 20 MEQ PO PACK
40.0000 meq | PACK | Freq: Once | ORAL | Status: DC
  Filled 2017-12-28: qty 2

## 2017-12-28 MED ORDER — ONDANSETRON HCL 4 MG PO TABS: 4.0000 mg | ORAL_TABLET | Freq: Three times a day (TID) | ORAL | 1 refills | Status: DC | PRN

## 2017-12-28 NOTE — Progress Notes (Signed)
Patient is being discharged home. IV removed with cath intact. Reviewed meds, scripts, and last dose given.  Allowed time for questions.  

## 2017-12-28 NOTE — Care Management (Signed)
No discharge needs identified by members if the care team

## 2017-12-28 NOTE — Discharge Summary (Signed)
Discharge Summary  Patient ID: Briana Hines MRN: [id number] DOB/AGE: 1956/02/10 62 y.o.  Admit date: 12/24/2017 Discharge date: 12/28/2017  Discharge Diagnoses Post-operative nausea and emesis  Consultants None  Procedures None  HPI: 62 y.o. female presented to [facility] ED on 11/11 for evaluation of nausea and vomiting. Patient reports she started with nausea and vomiting since yesterday. Patient had recent hiatal hernia repair and incisional hernia repair. She was discharged 3 days ago. After discharge patient refers she was feeling well tolerating diet, but yesterday was not able to tolerate diet. Denies abdominal pain. No pain radiation. Nothing aggravates or improves pain. Patient vomiting has been dark. Denies fever or chills. Refers passing gas. At the moment of this evaluation, which was after the upper GI series, patient refers is not nauseous maybe due to anti emetics. Patient does not has appetite at this moment but wants ice chips  Hospital Course: Patient was admitted to general surgery for IVF rehydration and nausea and emesis control. This was uneventful and the patient tolerated IVF hydration without adverse events. Her diet was gradually advanced over the course of her hospital stay which she tolerated by the day of discharge. The remainder of patient's hospital course was essentially unremarkable, and discharge planning was initiated accordingly with patient safely able to be discharged home with appropriate discharge instructions, anti-emetics and PPI prescriptions, and outpatient follow-up after all of her questions were answered to her expressed satisfaction.     Allergies as of 12/28/2017   No Known Allergies     Medication List    TAKE these medications   amLODipine 5 MG tablet Commonly known as:  NORVASC Take 5 mg by mouth every morning.   aspirin EC 81 MG tablet Take 81 mg by mouth daily.   azithromycin 250 MG tablet Commonly known as:  ZITHROMAX As  directed   B-12 1000 MCG Caps Take 1,000 mcg by mouth daily.   Fish Oil 1200 MG Caps Take 1,200 mg by mouth daily.   ICAPS AREDS 2 PO Take 1 tablet by mouth 2 (two) times daily.   latanoprost 0.005 % ophthalmic solution Commonly known as:  XALATAN Place 1 drop into both eyes at bedtime.   metoCLOPramide 10 MG tablet Commonly known as:  REGLAN Take 1 tablet (10 mg total) by mouth every 8 (eight) hours as needed for nausea. What changed:  Another medication with the same name was added. Make sure you understand how and when to take each.   metoCLOPramide 10 MG tablet Commonly known as:  REGLAN Take 1 tablet (10 mg total) by mouth 4 (four) times daily. What changed:  You were already taking a medication with the same name, and this prescription was added. Make sure you understand how and when to take each.   metoprolol succinate 25 MG 24 hr tablet Commonly known as:  TOPROL-XL Take 25 mg by mouth at bedtime.   MULTI-VITAMINS Tabs Take 1 tablet by mouth 3 (three) times a week.   omeprazole 40 MG capsule Commonly known as:  PRILOSEC Take 1 capsule (40 mg total) by mouth daily.   ondansetron 4 MG tablet Commonly known as:  ZOFRAN Take 1 tablet (4 mg total) by mouth every 8 (eight) hours as needed for nausea or vomiting.   ondansetron 8 MG disintegrating tablet Commonly known as:  ZOFRAN-ODT Take 1 tablet (8 mg total) by mouth every 8 (eight) hours as needed for nausea or vomiting.   oxyCODONE 5 MG immediate release tablet Commonly known  as:  Oxy IR/ROXICODONE Take 1 tablet (5 mg total) by mouth every 4 (four) hours as needed for severe pain or breakthrough pain.        Follow-up Information    [place], GeorgiaDiego F, MD Follow up on 12/31/2017.   Specialty:  General Surgery Why:  Go to your already scheduled appointment with Dr Everlene FarrierPabon on 11/18 Contact information: [address] [place] [address] [phone]           Signed: Lynden OxfordZachary Laraine Samet ,  PA-C [facility]  12/28/2017, 10:51 AM [phone] M-F: 7am - 4pm

## 2017-12-28 NOTE — Consult Note (Signed)
PHARMACY CONSULT NOTE - progress  Pharmacy Consult for Electrolyte Monitoring and Replacement   Recent Labs: Potassium (mmol/L)  Date Value  12/28/2017 3.6   Magnesium (mg/dL)  Date Value  16/10/960411/07/2017 1.8   Calcium (mg/dL)  Date Value  54/09/811911/15/2019 8.6 (L)   Albumin (g/dL)  Date Value  14/78/295611/12/2017 3.8   Phosphorus (mg/dL)  Date Value  21/30/865711/07/2017 3.6  ]   Assessment: Patient potassium dropping over inpatient stay due to low po intake and vomiting.  Pharmacy has been consulted for potassium replenishment and monitoring.  Goal of Therapy:  Potassium ~ 4  Plan:  Will give potassium PO 40 mEq today and recheck labs in the AM.    Albina Billetharles M Tannis Burstein ,PharmD Clinical Pharmacist 12/28/2017 8:09 AM

## 2017-12-31 ENCOUNTER — Encounter: Payer: Self-pay | Admitting: Surgery

## 2017-12-31 ENCOUNTER — Other Ambulatory Visit: Payer: Self-pay

## 2017-12-31 ENCOUNTER — Ambulatory Visit (INDEPENDENT_AMBULATORY_CARE_PROVIDER_SITE_OTHER): Payer: Managed Care, Other (non HMO) | Admitting: Surgery

## 2017-12-31 VITALS — BP 118/81 | HR 96 | Temp 97.9°F | Resp 14 | Ht 67.0 in | Wt 211.0 lb

## 2017-12-31 DIAGNOSIS — Z09 Encounter for follow-up examination after completed treatment for conditions other than malignant neoplasm: Secondary | ICD-10-CM

## 2017-12-31 NOTE — Progress Notes (Signed)
S/p redo paraesophageal Doing much better Taking Po and no emesis Taking eggs, cheese  Had some left sided chest pain that subsided NO fevers or chills Having normal Bms  PE NAD Abd: soft, nt, incisions c/d/i, no infection or peritnitis. We will remove staples  A/p Doing well Resolved intractable emesis May slowly progress on her diet RTC next week

## 2017-12-31 NOTE — Patient Instructions (Signed)
Follow up next week on Wednesday You may shower The steri strips will fall of on their own May use heat to the area on your side as needed, may use OTC Motrin or Aleve

## 2018-01-03 ENCOUNTER — Telehealth: Payer: Self-pay

## 2018-01-03 NOTE — Telephone Encounter (Signed)
EMMI Follow-up: Noted on the report that the patient hadn't read discharge paperwork and had other questions. I talked with Ms. Briana Hines had she had read her discharge papers now and was aware of her follow-up appointment. She wanted to relay that she suffered extreme pain (burning and stinging) through two bags of potassium before she asked them to stop it. IV team was called and they figured out that no saline was going in just the potassium.  She wanted to bring this to our attention so no one else has to experience this. I apologized several times and let her know that I would forward this concern to leadership.   She also asked how she could get an itemized copy of her bill as [facility] and she needed to send them a copy.  I talked with Briana Hines and she directed me to provide her with the [facility] number  [phone].  I provided Ms. Briana Hines with the phone number when I called her back and and she was appreciative of my help.

## 2018-01-09 ENCOUNTER — Encounter: Payer: Self-pay | Admitting: Surgery

## 2018-01-09 ENCOUNTER — Other Ambulatory Visit: Payer: Self-pay

## 2018-01-09 ENCOUNTER — Ambulatory Visit (INDEPENDENT_AMBULATORY_CARE_PROVIDER_SITE_OTHER): Payer: Managed Care, Other (non HMO) | Admitting: Surgery

## 2018-01-09 VITALS — BP 166/103 | HR 90 | Temp 97.7°F | Resp 16 | Ht 67.0 in | Wt 209.0 lb

## 2018-01-09 DIAGNOSIS — Z09 Encounter for follow-up examination after completed treatment for conditions other than malignant neoplasm: Secondary | ICD-10-CM

## 2018-01-09 MED ORDER — METOCLOPRAMIDE HCL 10 MG PO TABS: 10.0000 mg | ORAL_TABLET | Freq: Two times a day (BID) | ORAL | 0 refills | Status: DC

## 2018-01-09 NOTE — Patient Instructions (Signed)
Return in two weeks. The patient is aware to call back for any questions or concerns.  

## 2018-01-14 ENCOUNTER — Encounter: Payer: Self-pay | Admitting: Surgery

## 2018-01-14 NOTE — Progress Notes (Signed)
S/p redo paraesophageal Continues to improve No abd pain Taking Po and no emesis NO fevers or chills Having normal Bms  PE NAD Abd: soft, nt, incisions c/d/i, no infection or peritnitis.    A/p Doing well Resolved intractable emesis May slowly progress on her diet RTC 2 week

## 2018-01-28 ENCOUNTER — Other Ambulatory Visit: Payer: Self-pay

## 2018-01-28 ENCOUNTER — Encounter: Payer: Self-pay | Admitting: Surgery

## 2018-01-28 ENCOUNTER — Ambulatory Visit (INDEPENDENT_AMBULATORY_CARE_PROVIDER_SITE_OTHER): Payer: Managed Care, Other (non HMO) | Admitting: Surgery

## 2018-01-28 VITALS — BP 139/85 | HR 71 | Temp 97.7°F | Resp 18 | Ht 67.0 in | Wt 212.0 lb

## 2018-01-28 DIAGNOSIS — Z09 Encounter for follow-up examination after completed treatment for conditions other than malignant neoplasm: Secondary | ICD-10-CM

## 2018-01-28 NOTE — Progress Notes (Signed)
S/p Redo Nissen and HH repair Doing much better Taking Po no dysphagia Nausea significantly improved Reflux greatly improved as compared to before the surgery No fevers or chills + BM  PE NAD Abd: soft, nt, incisions healing well , no infection  A/P Doing very well Advance diet No complications RTC prn

## 2018-01-28 NOTE — Patient Instructions (Signed)
Please call our office if you have any questions or concerns.  

## 2018-11-29 ENCOUNTER — Other Ambulatory Visit: Payer: Self-pay | Admitting: Family Medicine

## 2018-11-29 DIAGNOSIS — Z1231 Encounter for screening mammogram for malignant neoplasm of breast: Secondary | ICD-10-CM

## 2019-04-02 ENCOUNTER — Ambulatory Visit
Admission: RE | Admit: 2019-04-02 | Discharge: 2019-04-02 | Disposition: A | Discharge status: Home / Self Care | Payer: Managed Care, Other (non HMO) | Source: Ambulatory Visit | Attending: Family Medicine | Admitting: Family Medicine

## 2019-04-02 DIAGNOSIS — Z1231 Encounter for screening mammogram for malignant neoplasm of breast: Secondary | ICD-10-CM | POA: Diagnosis not present

## 2019-08-15 IMAGING — MG MM DIGITAL SCREENING BILAT W/ TOMO W/ CAD
6 of 10 series · 6 of 30 positions shown · non-contrast
Comparison: Previous exam(s).

CLINICAL DATA: Screening.

EXAM:
DIGITAL SCREENING BILATERAL MAMMOGRAM WITH TOMO AND CAD

[L CC synth-2D]
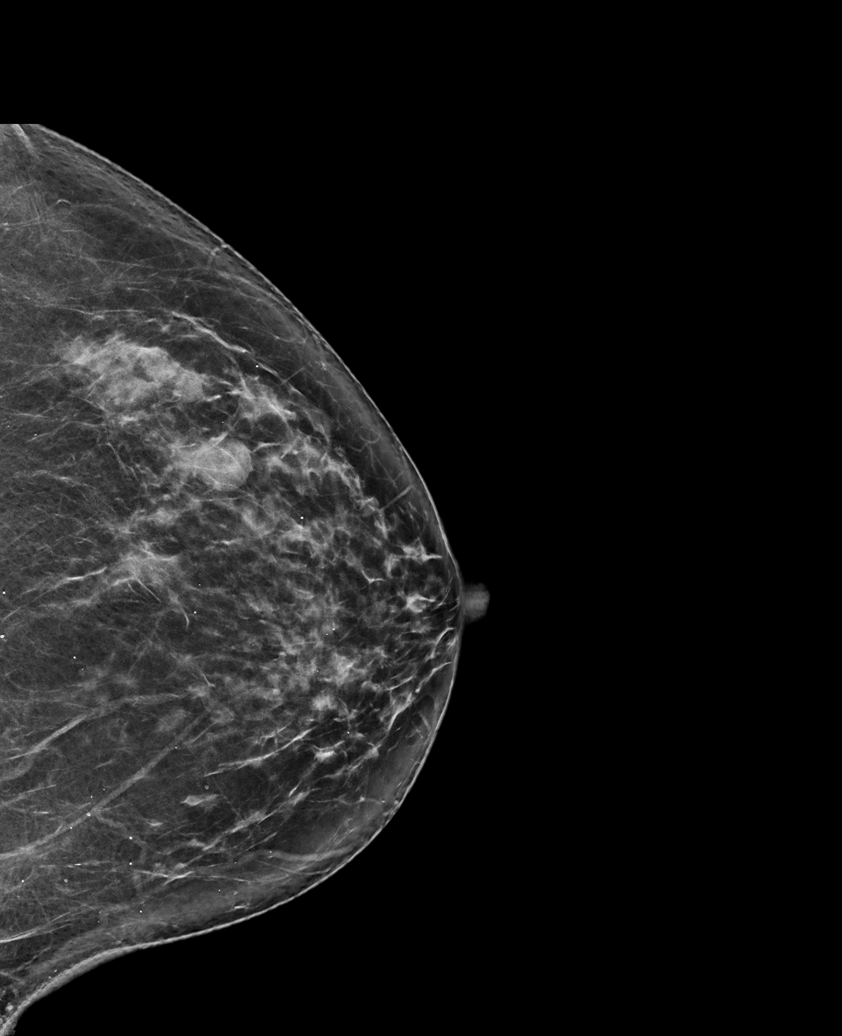

[L MLO synth-2D]
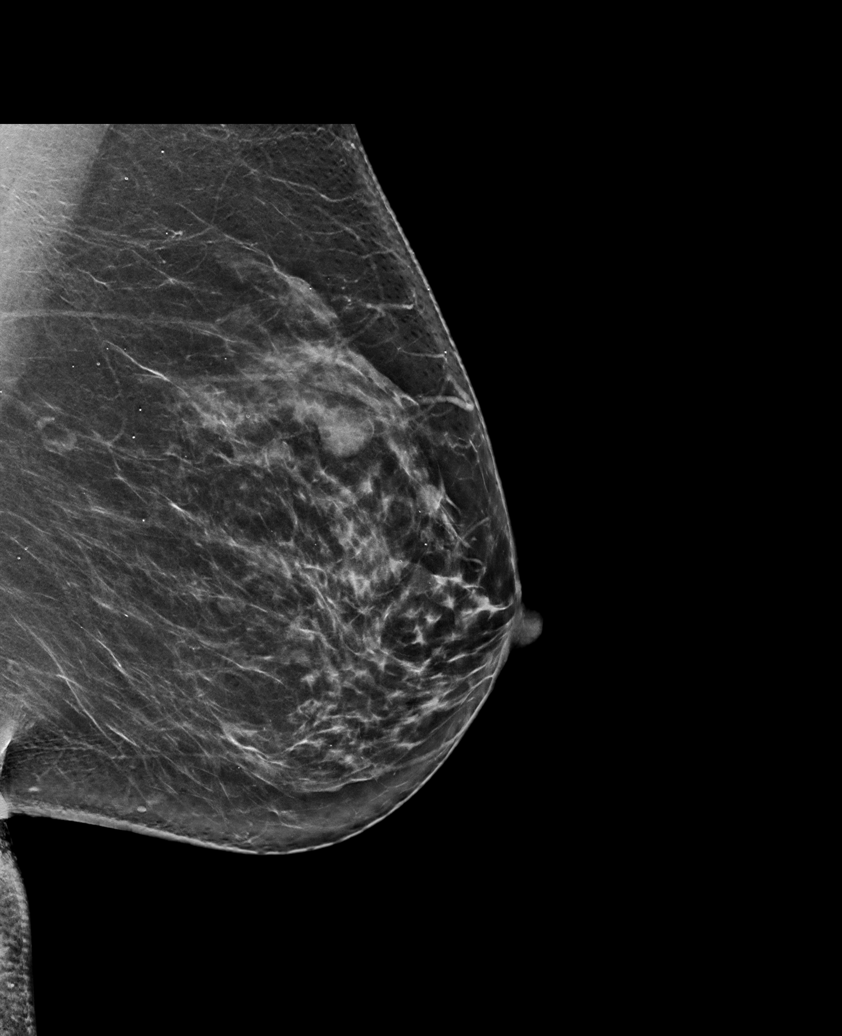

[R MLO synth-2D (1 of 2)]
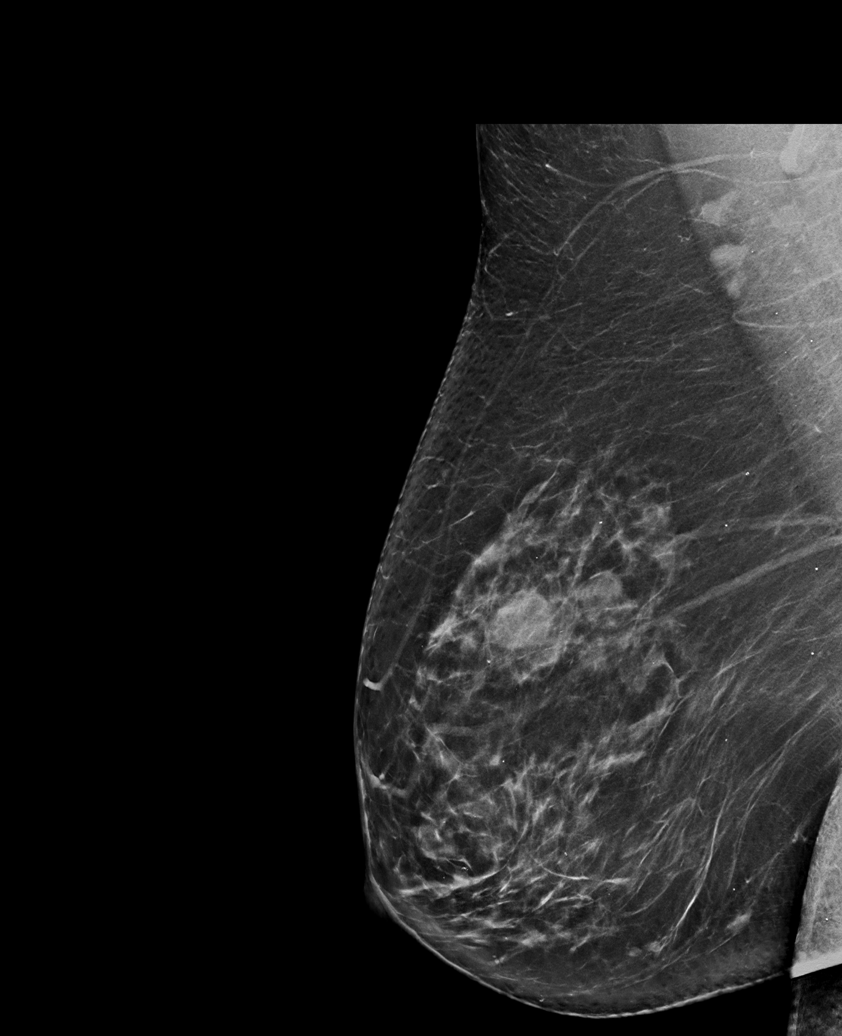

[R CC synth-2D]
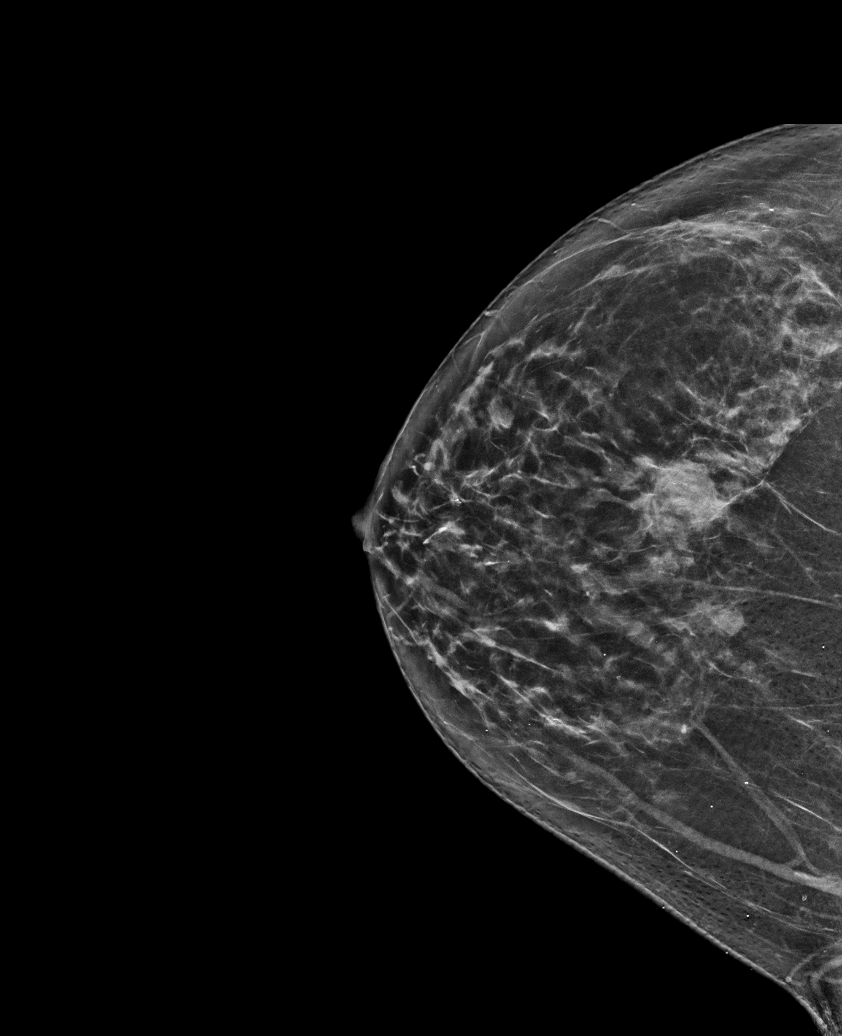

[R MLO synth-2D (2 of 2)]
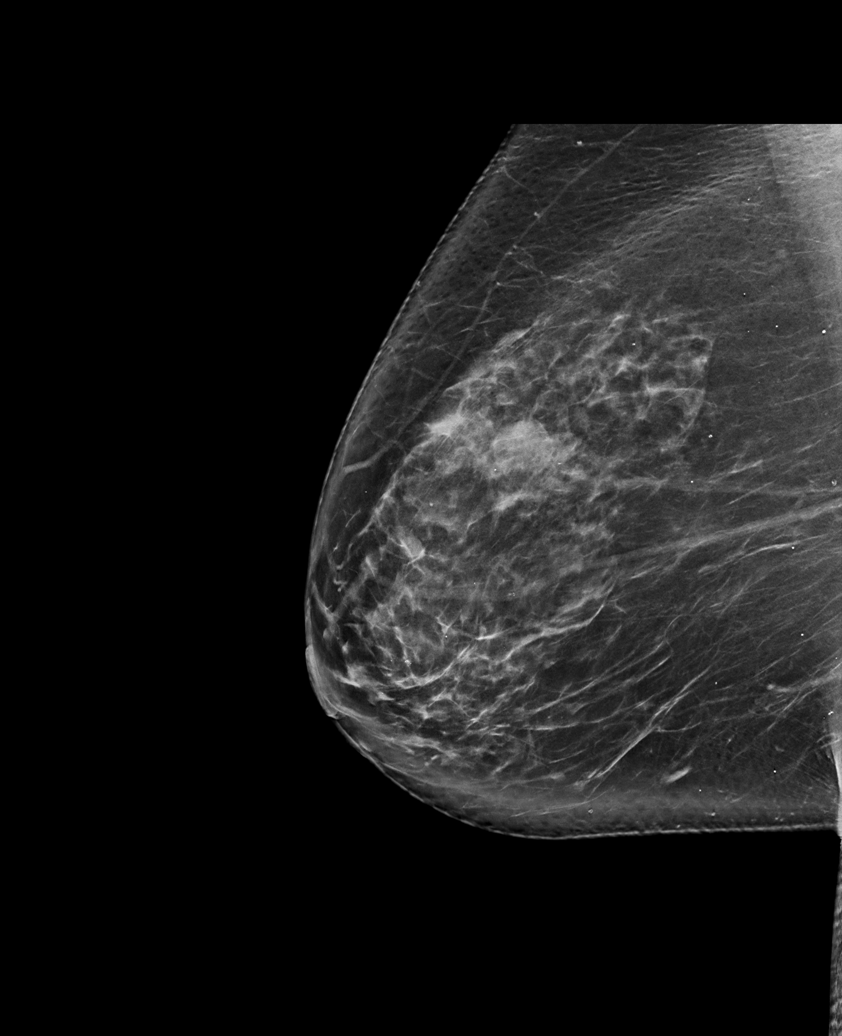

[R MLO tomo · tomo slice 49/97.0]
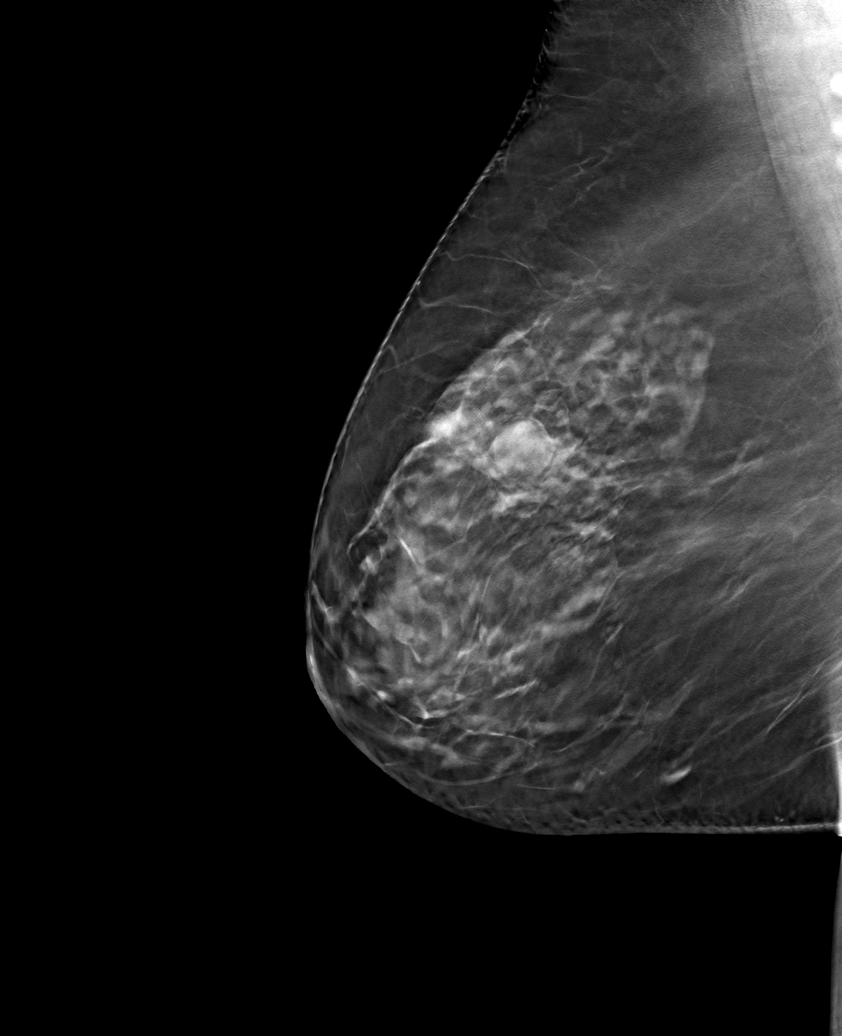

[6 of 30 positions shown; findings below may reference images not displayed]

ACR Breast Density Category c: The breast tissue is heterogeneously
dense, which may obscure small masses.
FINDINGS: There are no findings suspicious for malignancy. Images were
processed with CAD.
IMPRESSION: No mammographic evidence of malignancy. A result letter of this
screening mammogram will be mailed directly to the patient.

RECOMMENDATION:
Screening mammogram in one year. (Code:[id number])

BI-RADS CATEGORY  1: Negative.

## 2020-05-14 ENCOUNTER — Other Ambulatory Visit: Payer: Self-pay | Admitting: Family Medicine

## 2020-05-18 ENCOUNTER — Other Ambulatory Visit: Payer: Self-pay | Admitting: Family Medicine

## 2020-05-18 DIAGNOSIS — Z1231 Encounter for screening mammogram for malignant neoplasm of breast: Secondary | ICD-10-CM

## 2020-06-28 ENCOUNTER — Other Ambulatory Visit: Payer: Self-pay

## 2020-06-28 ENCOUNTER — Ambulatory Visit
Admission: RE | Admit: 2020-06-28 | Discharge: 2020-06-28 | Disposition: A | Discharge status: Home / Self Care | Payer: Managed Care, Other (non HMO) | Source: Ambulatory Visit | Attending: Family Medicine | Admitting: Family Medicine

## 2020-06-28 DIAGNOSIS — Z1231 Encounter for screening mammogram for malignant neoplasm of breast: Secondary | ICD-10-CM | POA: Diagnosis not present

## 2020-11-20 ENCOUNTER — Other Ambulatory Visit: Payer: Self-pay

## 2020-11-20 ENCOUNTER — Emergency Department: Payer: Managed Care, Other (non HMO)

## 2020-11-20 ENCOUNTER — Emergency Department
Admission: EM | Admit: 2020-11-20 | Discharge: 2020-11-20 | Disposition: A | Discharge status: Home / Self Care | Payer: Managed Care, Other (non HMO) | Attending: Student in an Organized Health Care Education/Training Program | Admitting: Student in an Organized Health Care Education/Training Program

## 2020-11-20 ENCOUNTER — Encounter: Payer: Self-pay | Admitting: Emergency Medicine

## 2020-11-20 DIAGNOSIS — I10 Essential (primary) hypertension: Secondary | ICD-10-CM | POA: Diagnosis not present

## 2020-11-20 DIAGNOSIS — Z79899 Other long term (current) drug therapy: Secondary | ICD-10-CM | POA: Insufficient documentation

## 2020-11-20 DIAGNOSIS — R1013 Epigastric pain: Secondary | ICD-10-CM | POA: Insufficient documentation

## 2020-11-20 DIAGNOSIS — R0789 Other chest pain: Secondary | ICD-10-CM | POA: Diagnosis not present

## 2020-11-20 DIAGNOSIS — Z7982 Long term (current) use of aspirin: Secondary | ICD-10-CM | POA: Insufficient documentation

## 2020-11-20 LAB — CBC
HCT: 34.2 % — ABNORMAL LOW (ref 36.0–46.0)
Hemoglobin: 10.9 g/dL — ABNORMAL LOW (ref 12.0–15.0)
MCH: 27.9 pg (ref 26.0–34.0)
MCHC: 31.9 g/dL (ref 30.0–36.0)
MCV: 87.7 fL (ref 80.0–100.0)
Platelets: 173 10*3/uL (ref 150–400)
RBC: 3.9 MIL/uL (ref 3.87–5.11)
RDW: 18.6 % — ABNORMAL HIGH (ref 11.5–15.5)
WBC: 6.5 10*3/uL (ref 4.0–10.5)
nRBC: 0 % (ref 0.0–0.2)

## 2020-11-20 LAB — BASIC METABOLIC PANEL
Anion gap: 5 (ref 5–15)
BUN: 17 mg/dL (ref 8–23)
CO2: 27 mmol/L (ref 22–32)
Calcium: 9.1 mg/dL (ref 8.9–10.3)
Chloride: 105 mmol/L (ref 98–111)
Creatinine, Ser: 0.82 mg/dL (ref 0.44–1.00)
GFR, Estimated: >60 mL/min (ref >60)
Glucose, Bld: 107 mg/dL — ABNORMAL HIGH (ref 70–99)
Potassium: 4.1 mmol/L (ref 3.5–5.1)
Sodium: 137 mmol/L (ref 135–145)

## 2020-11-20 LAB — D-DIMER, QUANTITATIVE: D-Dimer, Quant: 0.37 ug/mL-FEU (ref 0.00–0.50)

## 2020-11-20 LAB — HEPATIC FUNCTION PANEL
ALT: 21 U/L (ref 0–44)
AST: 22 U/L (ref 15–41)
Albumin: 4.2 g/dL (ref 3.5–5.0)
Alkaline Phosphatase: 77 U/L (ref 38–126)
Bilirubin, Direct: 0.1 mg/dL (ref 0.0–0.2)
Indirect Bilirubin: 0.5 mg/dL (ref 0.3–0.9)
Total Bilirubin: 0.6 mg/dL (ref 0.3–1.2)
Total Protein: 6.5 g/dL (ref 6.5–8.1)

## 2020-11-20 LAB — TROPONIN I (HIGH SENSITIVITY)
Troponin I (High Sensitivity): 2 ng/L (ref <18)
Troponin I (High Sensitivity): 3 ng/L (ref <18)

## 2020-11-20 LAB — LIPASE, BLOOD: Lipase: 27 U/L (ref 11–51)

## 2020-11-20 MED ORDER — LIDOCAINE VISCOUS HCL 2 % MT SOLN
15.0000 mL | Freq: Once | OROMUCOSAL | Status: DC
  Filled 2020-11-20: qty 15

## 2020-11-20 MED ORDER — ALUM & MAG HYDROXIDE-SIMETH 200-200-20 MG/5ML PO SUSP
30.0000 mL | Freq: Once | ORAL | Status: DC
  Filled 2020-11-20: qty 30

## 2020-11-20 NOTE — ED Provider Notes (Signed)
[facility] Emergency Department Provider Note    Event Date/Time   First MD Initiated Contact with Patient 11/20/20 1913     (approximate)  I have reviewed the triage vital signs and the nursing notes.   HISTORY  Chief Complaint Chest Pain    HPI Briana Hines is a 65 y.o. female history of reflux hypertension hypercholesterol presents to the ER for evaluation of midepigastric pain chest discomfort radiating to her left shoulder.  States it was worse when she was laying flat while at the salon earlier today.  States is intermittent and lasts a few minutes.  States when she bends forward she feels more pressure.  She tried belching without any significant change in the discomfort.  No vomiting.  States she has a history of hiatal hernia and history of hernia repair.  She is tolerating p.o.  Move her bowels.  No history of heart attack.  Past Medical History:  Diagnosis Date   Complication of anesthesia    GERD (gastroesophageal reflux disease)    History of hiatal hernia    Hypertension    PONV (postoperative nausea and vomiting)    dry heaves in 2011 only   Family History  Problem Relation Age of Onset   COPD Mother    Cancer Mother        not sure what type   Hypertension Mother    COPD Father    Heart attack Brother    High blood pressure Brother    Breast cancer Neg Hx    Past Surgical History:  Procedure Laterality Date   BREAST BIOPSY Left 1990's   benign   CATARACT EXTRACTION, BILATERAL  09/2017   HERNIA REPAIR  2010   hiatal, incisional   ROBOTIC ASSISTED LAPAROSCOPIC REPAIR OF PARAESOPHAGEAL HERNIA N/A 12/18/2017   Procedure: ATTEMPTED ROBOTIC ASSISTED LAPAROSCOPIC REPAIR OF PARAESOPHAGEAL HERNIA;  Surgeon: Leafy Ro, MD;  Location: [facility] ORS;  Service: General;  Laterality: N/A;   TONSILLECTOMY     age 2   VENTRAL HERNIA REPAIR  12/18/2017   Procedure: HERNIA REPAIR VENTRAL ADULT;  Surgeon: Leafy Ro, MD;  Location: [facility]  ORS;  Service: General;;   Patient Active Problem List   Diagnosis Date Noted   PONV (postoperative nausea and vomiting) 12/24/2017   S/P repair of paraesophageal hernia 12/18/2017   Hiatal hernia with gastroesophageal reflux    CRVO (central retinal vein occlusion) 10/08/2017   Hiatal hernia 05/20/2013   Anemia, unspecified 05/20/2013   History of pneumonia 05/20/2013      Prior to Admission medications   Medication Sig Start Date End Date Taking? Authorizing Provider  amLODipine (NORVASC) 5 MG tablet Take 5 mg by mouth every morning.  08/31/16   [provider]  aspirin EC 81 MG tablet Take 81 mg by mouth daily.    [provider]  Cyanocobalamin (B-12) 1000 MCG CAPS Take 1,000 mcg by mouth daily.     [provider]  latanoprost (XALATAN) 0.005 % ophthalmic solution Place 1 drop into both eyes at bedtime.     [provider]  metoCLOPramide (REGLAN) 10 MG tablet Take 1 tablet (10 mg total) by mouth every 8 (eight) hours as needed for nausea. 12/24/17   Pabon, Diego F, MD  metoCLOPramide (REGLAN) 10 MG tablet Take 1 tablet (10 mg total) by mouth 4 (four) times daily. 12/28/17   Donovan Kail, PA-C  metoCLOPramide (REGLAN) 10 MG tablet Take 1 tablet (10 mg total)  by mouth 2 (two) times daily. 01/09/18   Pabon, Diego F, MD  metoprolol succinate (TOPROL-XL) 25 MG 24 hr tablet Take 25 mg by mouth at bedtime.  08/31/16   [provider]  Multiple Vitamin (MULTI-VITAMINS) TABS Take 1 tablet by mouth 3 (three) times a week.     [provider]  Multiple Vitamins-Minerals (ICAPS AREDS 2 PO) Take 1 tablet by mouth 2 (two) times daily.     [provider]  Omega-3 Fatty Acids (FISH OIL) 1200 MG CAPS Take 1,200 mg by mouth daily.     [provider]  omeprazole (PRILOSEC) 40 MG capsule Take 1 capsule (40 mg total) by mouth daily. 12/28/17   Donovan Kail, PA-C    Allergies Patient has no known  allergies.    Social History Social History   Tobacco Use   Smoking status: Never   Smokeless tobacco: Never  Vaping Use   Vaping Use: Never used  Substance Use Topics   Alcohol use: Never   Drug use: Never    Review of Systems Patient denies headaches, rhinorrhea, blurry vision, numbness, shortness of breath, chest pain, edema, cough, abdominal pain, nausea, vomiting, diarrhea, dysuria, fevers, rashes or hallucinations unless otherwise stated above in HPI. ____________________________________________   PHYSICAL EXAM:  VITAL SIGNS: Vitals:   11/20/20 2030 11/20/20 2100  BP: (!) 120/52 (!) 118/98  Pulse: (!) 57 (!) 57  Resp: 16 16  Temp:    SpO2: 97% 97%    Constitutional: Alert and oriented.  Eyes: Conjunctivae are normal.  Head: Atraumatic. Nose: No congestion/rhinnorhea. Mouth/Throat: Mucous membranes are moist.   Neck: No stridor. Painless ROM.  Cardiovascular: Normal rate, regular rhythm. Grossly normal heart sounds.  Good peripheral circulation. Respiratory: Normal respiratory effort.  No retractions. Lungs CTAB. Gastrointestinal: Soft and nontender. No distention. No abdominal bruits. No CVA tenderness. Genitourinary:  Musculoskeletal: No lower extremity tenderness nor edema.  No joint effusions. Neurologic:  Normal speech and language. No gross focal neurologic deficits are appreciated. No facial droop Skin:  Skin is warm, dry and intact. No rash noted. Psychiatric: Mood and affect are normal. Speech and behavior are normal.  ____________________________________________   LABS (all labs ordered are listed, but only abnormal results are displayed)  Results for orders placed or performed during the hospital encounter of 11/20/20 (from the past 24 hour(s))  Basic metabolic panel     Status: Abnormal   Collection Time: 11/20/20  5:56 PM  Result Value Ref Range   Sodium 137 135 - 145 mmol/L   Potassium 4.1 3.5 - 5.1 mmol/L   Chloride 105 98 - 111 mmol/L    CO2 27 22 - 32 mmol/L   Glucose, Bld 107 (H) 70 - 99 mg/dL   BUN 17 8 - 23 mg/dL   Creatinine, Ser 8.25 0.44 - 1.00 mg/dL   Calcium 9.1 8.9 - 05.3 mg/dL   GFR, Estimated >97 >67 mL/min   Anion gap 5 5 - 15  CBC     Status: Abnormal   Collection Time: 11/20/20  5:56 PM  Result Value Ref Range   WBC 6.5 4.0 - 10.5 K/uL   RBC 3.90 3.87 - 5.11 MIL/uL   Hemoglobin 10.9 (L) 12.0 - 15.0 g/dL   HCT 34.1 (L) 93.7 - 90.2 %   MCV 87.7 80.0 - 100.0 fL   MCH 27.9 26.0 - 34.0 pg   MCHC 31.9 30.0 - 36.0 g/dL   RDW 40.9 (H) 73.5 - 32.9 %  Platelets 173 150 - 400 K/uL   nRBC 0.0 0.0 - 0.2 %  Troponin I (High Sensitivity)     Status: None   Collection Time: 11/20/20  5:56 PM  Result Value Ref Range   Troponin I (High Sensitivity) 2 <18 ng/L  Hepatic function panel     Status: None   Collection Time: 11/20/20  7:28 PM  Result Value Ref Range   Total Protein 6.5 6.5 - 8.1 g/dL   Albumin 4.2 3.5 - 5.0 g/dL   AST 22 15 - 41 U/L   ALT 21 0 - 44 U/L   Alkaline Phosphatase 77 38 - 126 U/L   Total Bilirubin 0.6 0.3 - 1.2 mg/dL   Bilirubin, Direct 0.1 0.0 - 0.2 mg/dL   Indirect Bilirubin 0.5 0.3 - 0.9 mg/dL  Lipase, blood     Status: None   Collection Time: 11/20/20  7:28 PM  Result Value Ref Range   Lipase 27 11 - 51 U/L  Troponin I (High Sensitivity)     Status: None   Collection Time: 11/20/20  8:20 PM  Result Value Ref Range   Troponin I (High Sensitivity) 3 <18 ng/L  D-dimer, quantitative     Status: None   Collection Time: 11/20/20  8:20 PM  Result Value Ref Range   D-Dimer, Quant 0.37 0.00 - 0.50 ug/mL-FEU   ____________________________________________  EKG My review and personal interpretation at Time: 17:40   Indication: chest pain  Rate: 75  Rhythm: sinus Axis: normal Other: normal intervals, no stemi ____________________________________________  RADIOLOGY  I personally reviewed all radiographic images ordered to evaluate for the above acute complaints and reviewed  radiology reports and findings.  These findings were personally discussed with the patient.  Please see medical record for radiology report.  ____________________________________________   PROCEDURES  Procedure(s) performed:  Procedures    Critical Care performed: no ____________________________________________   INITIAL IMPRESSION / ASSESSMENT AND PLAN / ED COURSE  Pertinent labs & imaging results that were available during my care of the patient were reviewed by me and considered in my medical decision making (see chart for details).   DDX: ACS, pericarditis, esophagitis, boerhaaves, pe, dissection, pna, bronchitis, costochondritis   ZENYA HICKAM is a 65 y.o. who presents to the ED with symptoms as described above.  Patient nontoxic-appearing pleasant no acute distress.  EKG nonischemic initial troponin negative.  No white count.  Benign abdominal exam.  She is low risk by heart score low risk by Wells criteria.  Will observe for serial enzymes as well as order D-dimer to further stratify.  Chest x-ray does show hiatal hernia and some of her symptoms are can assistant with reflux that she has a history of that.  She is declining any medication at this time as she is pain-free.  Clinical Course as of 11/20/20 2117  Sat Nov 20, 2020  2113 Patient reassessed she feels well.  Has not had any pain since being in the ER.  Have a lower suspicion for cardiac chest pain as her troponins are negative EKG is nonischemic.  Her D-dimer is negative.  This not consistent with PE or dissection.  Her abdominal exam is soft and benign.  She does appear stable appropriate for outpatient follow-up.  Patient agreeable to plan.  We discussed strict return precautions. [PR]    Clinical Course User Index [PR] Willy Eddy, MD    The patient was evaluated in Emergency Department today for the symptoms described in the history of  present illness. He/she was evaluated in the context of the global  COVID-19 pandemic, which necessitated consideration that the patient might be at risk for infection with the SARS-CoV-2 virus that causes COVID-19. Institutional protocols and algorithms that pertain to the evaluation of patients at risk for COVID-19 are in a state of rapid change based on information released by regulatory bodies including the CDC and federal and state organizations. These policies and algorithms were followed during the patient's care in the ED.  As part of my medical decision making, I reviewed the following data within the electronic MEDICAL RECORD NUMBER Nursing notes reviewed and incorporated, Labs reviewed, notes from prior ED visits and [place] Controlled Substance Database   ____________________________________________   FINAL CLINICAL IMPRESSION(S) / ED DIAGNOSES  Final diagnoses:  Atypical chest pain      NEW MEDICATIONS STARTED DURING THIS VISIT:  New Prescriptions   No medications on file     Note:  This document was prepared using Dragon voice recognition software and may include unintentional dictation errors.    Willy Eddy, MD 11/20/20 2117

## 2020-11-20 NOTE — ED Triage Notes (Signed)
Pt via POV from home. Pt c/o intermittent centralized CP since 12:30 today. Pt states it radiates to her L shoulder blade and under her L breast. Denies cardiac hx. Denies SOB. Denies NVD. Pt is A&Ox4 and NAD.

## 2020-12-06 ENCOUNTER — Encounter (INDEPENDENT_AMBULATORY_CARE_PROVIDER_SITE_OTHER): Payer: Self-pay | Admitting: Ophthalmology

## 2020-12-06 ENCOUNTER — Other Ambulatory Visit: Payer: Self-pay

## 2020-12-06 ENCOUNTER — Ambulatory Visit (INDEPENDENT_AMBULATORY_CARE_PROVIDER_SITE_OTHER): Payer: Managed Care, Other (non HMO) | Admitting: Ophthalmology

## 2020-12-06 DIAGNOSIS — H353132 Nonexudative age-related macular degeneration, bilateral, intermediate dry stage: Secondary | ICD-10-CM

## 2020-12-06 DIAGNOSIS — H353211 Exudative age-related macular degeneration, right eye, with active choroidal neovascularization: Secondary | ICD-10-CM

## 2020-12-06 DIAGNOSIS — H348112 Central retinal vein occlusion, right eye, stable: Secondary | ICD-10-CM

## 2020-12-06 DIAGNOSIS — H353131 Nonexudative age-related macular degeneration, bilateral, early dry stage: Secondary | ICD-10-CM | POA: Insufficient documentation
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
## 2020-12-06 NOTE — Assessment & Plan Note (Addendum)
OD, history of central retinal vein occlusion treated from 2015-2019 with intravitreal Lucentis in the past with excellent resolution and stabilization, not active OD now

## 2020-12-08 ENCOUNTER — Ambulatory Visit (INDEPENDENT_AMBULATORY_CARE_PROVIDER_SITE_OTHER): Payer: Managed Care, Other (non HMO) | Admitting: Ophthalmology

## 2020-12-08 ENCOUNTER — Encounter (INDEPENDENT_AMBULATORY_CARE_PROVIDER_SITE_OTHER): Payer: Self-pay | Admitting: Ophthalmology

## 2020-12-08 ENCOUNTER — Other Ambulatory Visit: Payer: Self-pay

## 2020-12-08 DIAGNOSIS — H353211 Exudative age-related macular degeneration, right eye, with active choroidal neovascularization: Secondary | ICD-10-CM | POA: Diagnosis not present

## 2020-12-08 MED ORDER — BEVACIZUMAB 2.5 MG/0.1ML IZ SOSY
2.5000 mg | PREFILLED_SYRINGE | INTRAVITREAL | Status: AC | PRN
  Administered 2020-12-08: 2.5 mg via INTRAVITREAL

## 2020-12-08 NOTE — Progress Notes (Signed)
12/08/2020     CHIEF COMPLAINT Patient presents for  Chief Complaint  Patient presents with   Retina Follow Up      HISTORY OF PRESENT ILLNESS: Briana Hines is a 65 y.o. female who presents to the clinic today for:   HPI     Retina Follow Up   Patient presents with  Wet AMD.  In right eye.  This started 2 days ago.  Severity is mild.  Duration of 2 days.  Since onset it is stable.        Comments   2 day avastin od and oct and no dilation Pt states [facility] OU stable since last visit. Pt denies FOL, floaters, or ocular pain OU.        Last edited by Demetrios Loll, COA on 12/08/2020  8:00 AM.      Referring physician: Jerl Mina, MD [address] [facility] [place],  [place] [address]  HISTORICAL INFORMATION:   Selected notes from the MEDICAL RECORD NUMBER       CURRENT MEDICATIONS: Current Outpatient Medications (Ophthalmic Drugs)  Medication Sig   latanoprost (XALATAN) 0.005 % ophthalmic solution Place 1 drop into both eyes at bedtime.    No current facility-administered medications for this visit. (Ophthalmic Drugs)   Current Outpatient Medications (Other)  Medication Sig   amLODipine (NORVASC) 5 MG tablet Take 5 mg by mouth every morning.    aspirin EC 81 MG tablet Take 81 mg by mouth daily.   Cyanocobalamin (B-12) 1000 MCG CAPS Take 1,000 mcg by mouth daily.    metoCLOPramide (REGLAN) 10 MG tablet Take 1 tablet (10 mg total) by mouth every 8 (eight) hours as needed for nausea.   metoCLOPramide (REGLAN) 10 MG tablet Take 1 tablet (10 mg total) by mouth 4 (four) times daily.   metoCLOPramide (REGLAN) 10 MG tablet Take 1 tablet (10 mg total) by mouth 2 (two) times daily.   metoprolol succinate (TOPROL-XL) 25 MG 24 hr tablet Take 25 mg by mouth at bedtime.    Multiple Vitamin (MULTI-VITAMINS) TABS Take 1 tablet by mouth 3 (three) times a week.    Multiple Vitamins-Minerals (ICAPS AREDS 2 PO) Take 1 tablet by mouth 2 (two) times daily.     Omega-3 Fatty Acids (FISH OIL) 1200 MG CAPS Take 1,200 mg by mouth daily.    omeprazole (PRILOSEC) 40 MG capsule Take 1 capsule (40 mg total) by mouth daily.   No current facility-administered medications for this visit. (Other)      REVIEW OF SYSTEMS:    ALLERGIES No Known Allergies  PAST MEDICAL HISTORY Past Medical History:  Diagnosis Date   Complication of anesthesia    GERD (gastroesophageal reflux disease)    History of hiatal hernia    Hypertension    Macular degeneration    PONV (postoperative nausea and vomiting)    dry heaves in 2011 only   Past Surgical History:  Procedure Laterality Date   BREAST BIOPSY Left 1990's   benign   CATARACT EXTRACTION Bilateral    CATARACT EXTRACTION, BILATERAL  09/2017   HERNIA REPAIR  2010   hiatal, incisional   IRIDOTOMY / IRIDECTOMY     ROBOTIC ASSISTED LAPAROSCOPIC REPAIR OF PARAESOPHAGEAL HERNIA N/A 12/18/2017   Procedure: ATTEMPTED ROBOTIC ASSISTED LAPAROSCOPIC REPAIR OF PARAESOPHAGEAL HERNIA;  Surgeon: Leafy Ro, MD;  Location: [facility] ORS;  Service: General;  Laterality: N/A;   TONSILLECTOMY     age 76   VENTRAL HERNIA REPAIR  12/18/2017   Procedure: HERNIA REPAIR VENTRAL ADULT;  Surgeon: Leafy Ro, MD;  Location: [facility] ORS;  Service: General;;    FAMILY HISTORY Family History  Problem Relation Age of Onset   COPD Mother    Cancer Mother        not sure what type   Hypertension Mother    COPD Father    Heart attack Brother    High blood pressure Brother    Breast cancer Neg Hx     SOCIAL HISTORY Social History   Tobacco Use   Smoking status: Never   Smokeless tobacco: Never  Vaping Use   Vaping Use: Never used  Substance Use Topics   Alcohol use: Never   Drug use: Never         OPHTHALMIC EXAM:  Base Eye Exam     Visual Acuity (ETDRS)       Right Left   Dist cc 20/50 -2 20/25   Dist ph cc NI          Tonometry (Tonopen, 8:04 AM)       Right Left   Pressure 15 15          Pupils       Pupils Dark Light Shape React APD   Right PERRL 6 5 Round Brisk None   Left PERRL 6 5 Round Brisk None         Extraocular Movement       Right Left    Full Full         Neuro/Psych     Oriented x3: Yes   Mood/Affect: Normal         Dilation     Both eyes: no dilation @ 8:02 AM           Slit Lamp and Fundus Exam     External Exam       Right Left   External Normal Normal         Slit Lamp Exam       Right Left   Lids/Lashes Normal Normal   Conjunctiva/Sclera White and quiet White and quiet   Cornea Clear Clear   Anterior Chamber Deep and quiet Deep and quiet   Iris Round and reactive Round and reactive   Lens Clear Clear   Vitreous Normal Normal         Fundus Exam       Right Left   Disc Normal Normal   Macula Normal Normal   Vessels Normal Normal   Periphery Normal Normal            IMAGING AND PROCEDURES  Imaging and Procedures for 12/08/20  Intravitreal Injection, Pharmacologic Agent - OD - Right Eye       Time Out 12/08/2020. 8:24 AM. Confirmed correct patient, procedure, site, and patient consented.   Anesthesia Subconjunctival anesthesia was used. Anesthetic medications included Lidocaine 4%.   Procedure Preparation included 5% betadine to ocular surface, 10% betadine to eyelids, Tobramycin 0.3%. A 30 gauge needle was used.   Injection: 2.5 mg bevacizumab 2.5 MG/0.1ML   Route: Intravitreal, Site: Right Eye   NDC: [phone], Lot: [id number]   Post-op Post injection exam found visual acuity of at least counting fingers. The patient tolerated the procedure well. There were no complications. The patient received written and verbal post procedure care education. Post injection medications were not given.              ASSESSMENT/PLAN:  Exudative age-related macular  degeneration of right eye with active choroidal neovascularization (HCC) Commence therapy today intravitreal Avastin follow-up in 5  weeks     ICD-10-CM   1. Exudative age-related macular degeneration of right eye with active choroidal neovascularization (HCC)  H35.3211 Intravitreal Injection, Pharmacologic Agent - OD - Right Eye    bevacizumab (AVASTIN) SOSY 2.5 mg      1.  Commence with intravitreal Avastin OD today follow-up in 5 weeks  2.  3.  Ophthalmic Meds Ordered this visit:  Meds ordered this encounter  Medications   bevacizumab (AVASTIN) SOSY 2.5 mg       Return in about 5 weeks (around 01/12/2021) for dilate, OD, COLOR FP, AVASTIN OCT.  There are no Patient Instructions on file for this visit.   Explained the diagnoses, plan, and follow up with the patient and they expressed understanding.  Patient expressed understanding of the importance of proper follow up care.   Alford Highland Rielynn Trulson M.D. Diseases & Surgery of the Retina and Vitreous [facility] 12/08/20     Abbreviations: M myopia (nearsighted); A astigmatism; H hyperopia (farsighted); P presbyopia; Mrx spectacle prescription;  CTL contact lenses; OD right eye; OS left eye; OU both eyes  XT exotropia; ET esotropia; PEK punctate epithelial keratitis; PEE punctate epithelial erosions; DES dry eye syndrome; MGD meibomian gland dysfunction; ATs artificial tears; PFAT's preservative free artificial tears; NSC nuclear sclerotic cataract; PSC posterior subcapsular cataract; ERM epi-retinal membrane; PVD posterior vitreous detachment; RD retinal detachment; DM diabetes mellitus; DR diabetic retinopathy; NPDR non-proliferative diabetic retinopathy; PDR proliferative diabetic retinopathy; CSME clinically significant macular edema; DME diabetic macular edema; dbh dot blot hemorrhages; CWS cotton wool spot; POAG primary open angle glaucoma; C/D cup-to-disc ratio; HVF humphrey visual field; GVF goldmann visual field; OCT optical coherence tomography; IOP intraocular pressure; BRVO Branch retinal vein occlusion; CRVO central retinal vein  occlusion; CRAO central retinal artery occlusion; BRAO branch retinal artery occlusion; RT retinal tear; SB scleral buckle; PPV pars plana vitrectomy; VH Vitreous hemorrhage; PRP panretinal laser photocoagulation; IVK intravitreal kenalog; VMT vitreomacular traction; MH Macular hole;  NVD neovascularization of the disc; NVE neovascularization elsewhere; AREDS age related eye disease study; ARMD age related macular degeneration; POAG primary open angle glaucoma; EBMD epithelial/anterior basement membrane dystrophy; ACIOL anterior chamber intraocular lens; IOL intraocular lens; PCIOL posterior chamber intraocular lens; Phaco/IOL phacoemulsification with intraocular lens placement; PRK photorefractive keratectomy; LASIK laser assisted in situ keratomileusis; HTN hypertension; DM diabetes mellitus; COPD chronic obstructive pulmonary disease

## 2020-12-08 NOTE — Assessment & Plan Note (Signed)
Commence therapy today intravitreal Avastin follow-up in 5 weeks

## 2020-12-15 ENCOUNTER — Encounter (INDEPENDENT_AMBULATORY_CARE_PROVIDER_SITE_OTHER): Payer: Managed Care, Other (non HMO) | Admitting: Ophthalmology

## 2020-12-27 ENCOUNTER — Ambulatory Visit (INDEPENDENT_AMBULATORY_CARE_PROVIDER_SITE_OTHER): Payer: Managed Care, Other (non HMO) | Admitting: Surgery

## 2020-12-27 ENCOUNTER — Other Ambulatory Visit: Payer: Self-pay

## 2020-12-27 ENCOUNTER — Encounter: Payer: Self-pay | Admitting: Surgery

## 2020-12-27 VITALS — BP 115/78 | HR 84 | Temp 98.0°F | Ht 67.0 in | Wt 198.4 lb

## 2020-12-27 DIAGNOSIS — R1084 Generalized abdominal pain: Secondary | ICD-10-CM | POA: Diagnosis not present

## 2020-12-27 NOTE — Patient Instructions (Addendum)
We will get you set up for a CT scan to better look at the hernias.  We will have you follow up here after we get your CT results back.   You are scheduled for a CT scan at [facility] located at [facility] in [place] on Monday December 5th at 1:40 pm. You will need arrive there by 1:20 pm and have nothing to eat or drink for 4 hours prior. You will need to pick up a prep kit at the same location for this exam at least 2 days prior.

## 2020-12-29 ENCOUNTER — Encounter: Payer: Self-pay | Admitting: Surgery

## 2020-12-29 NOTE — Progress Notes (Signed)
Outpatient Surgical Follow Up  12/29/2020  Briana Hines is an 65 y.o. female.   Chief Complaint  Patient presents with   Follow-up    HPI: 28 female well-known to me with a history of grade 2 paraesophageal hernia repair 3 years ago by me.  Same time she did have a thyroplasty due to chronic gastroparesis.  This had to be open given the significant technical difficulties and significant scarring due to prior operations. He recently went to the emergency room with some chest pain and epigastric pain chest x-ray report shows some hiatal hernia.  Of note I have personally reviewed the films and there is significant improvement on the films we will compare preoperatively and postoperatively.  More importantly patient is feeling well her gastric emptying is significantly improved.  And she has no reflux.  She says that she has a total of 2 reflux episodes within the last 2 years.  She does have a history of bilateral inguinal hernias that have been hurting intermittently.  Past Medical History:  Diagnosis Date   Complication of anesthesia    GERD (gastroesophageal reflux disease)    History of hiatal hernia    Hypertension    Macular degeneration    PONV (postoperative nausea and vomiting)    dry heaves in 2011 only    Past Surgical History:  Procedure Laterality Date   BREAST BIOPSY Left 1990's   benign   CATARACT EXTRACTION Bilateral    CATARACT EXTRACTION, BILATERAL  09/2017   HERNIA REPAIR  2010   hiatal, incisional   IRIDOTOMY / IRIDECTOMY     ROBOTIC ASSISTED LAPAROSCOPIC REPAIR OF PARAESOPHAGEAL HERNIA N/A 12/18/2017   Procedure: ATTEMPTED ROBOTIC ASSISTED LAPAROSCOPIC REPAIR OF PARAESOPHAGEAL HERNIA;  Surgeon: Leafy Ro, MD;  Location: [facility] ORS;  Service: General;  Laterality: N/A;   TONSILLECTOMY     age 26   VENTRAL HERNIA REPAIR  12/18/2017   Procedure: HERNIA REPAIR VENTRAL ADULT;  Surgeon: Leafy Ro, MD;  Location: [facility] ORS;  Service: General;;    Family  History  Problem Relation Age of Onset   COPD Mother    Cancer Mother        not sure what type   Hypertension Mother    COPD Father    Heart attack Brother    High blood pressure Brother    Breast cancer Neg Hx     Social History:  reports that she has never smoked. She has never used smokeless tobacco. She reports that she does not drink alcohol and does not use drugs.  Allergies: No Known Allergies  Medications reviewed.    ROS Full ROS performed and is otherwise negative other than what is stated in HPI   BP 115/78   Pulse 84   Temp 98 F (36.7 C)   Ht 5\' 7"  (1.702 m)   Wt 198 lb 6.4 oz (90 kg)   SpO2 97%   BMI 31.07 kg/m   Physical Exam Vitals and nursing note reviewed. Exam conducted with a chaperone present.  Constitutional:      Appearance: Normal appearance. She is normal weight.  Cardiovascular:     Rate and Rhythm: Normal rate and regular rhythm.  Pulmonary:     Effort: Pulmonary effort is normal. No respiratory distress.     Breath sounds: Normal breath sounds. No stridor.  Abdominal:     General: Abdomen is flat. There is no distension.     Palpations: There is no mass.  Tenderness: There is no abdominal tenderness. There is no guarding or rebound.     Hernia: No hernia is present.     Comments: Prior laparotomy scars.  No incisional hernias  Musculoskeletal:     Cervical back: Normal range of motion and neck supple. No rigidity.  Skin:    Capillary Refill: Capillary refill takes less than 2 seconds.  Neurological:     General: No focal deficit present.     Mental Status: She is alert.  Psychiatric:        Mood and Affect: Mood normal.        Behavior: Behavior normal.        Thought Content: Thought content normal.        Judgment: Judgment normal.      Assessment/Plan: 65 year old female with history of redo redo paraesophageal hernia repair and Nissen fundoplication.  Questionable recurrence of the hiatal hernia.  I do think that we  need to first establish a better delineation of the anatomy and I will order a CT scan of the abdomen and pelvis.  There is no need for any immediate surgical intervention at this time.  I did discuss to her that this is a difficult situation if she has a recurrent issue given the previous surgeries that she has undergone.  Greater than 50% of the 40 minutes  visit was spent in counseling/coordination of care   Sterling Big, MD [facility] General Surgeon

## 2021-01-13 ENCOUNTER — Encounter (INDEPENDENT_AMBULATORY_CARE_PROVIDER_SITE_OTHER): Payer: Managed Care, Other (non HMO) | Admitting: Ophthalmology

## 2021-01-17 ENCOUNTER — Other Ambulatory Visit: Payer: Self-pay

## 2021-01-17 ENCOUNTER — Ambulatory Visit
Admission: RE | Admit: 2021-01-17 | Discharge: 2021-01-17 | Disposition: A | Discharge status: Home / Self Care | Payer: Managed Care, Other (non HMO) | Source: Ambulatory Visit | Attending: Surgery | Admitting: Surgery

## 2021-01-17 DIAGNOSIS — R1084 Generalized abdominal pain: Secondary | ICD-10-CM

## 2021-01-17 MED ORDER — IOPAMIDOL (ISOVUE-300) INJECTION 61%
100.0000 mL | Freq: Once | INTRAVENOUS | Status: AC | PRN
  Administered 2021-01-17: 100 mL via INTRAVENOUS

## 2021-01-18 ENCOUNTER — Encounter (INDEPENDENT_AMBULATORY_CARE_PROVIDER_SITE_OTHER): Payer: Managed Care, Other (non HMO) | Admitting: Ophthalmology

## 2021-01-18 ENCOUNTER — Ambulatory Visit (INDEPENDENT_AMBULATORY_CARE_PROVIDER_SITE_OTHER): Payer: Managed Care, Other (non HMO) | Admitting: Ophthalmology

## 2021-01-18 ENCOUNTER — Encounter (INDEPENDENT_AMBULATORY_CARE_PROVIDER_SITE_OTHER): Payer: Self-pay | Admitting: Ophthalmology

## 2021-01-18 DIAGNOSIS — H348112 Central retinal vein occlusion, right eye, stable: Secondary | ICD-10-CM

## 2021-01-18 DIAGNOSIS — H353131 Nonexudative age-related macular degeneration, bilateral, early dry stage: Secondary | ICD-10-CM | POA: Diagnosis not present

## 2021-01-18 DIAGNOSIS — H353211 Exudative age-related macular degeneration, right eye, with active choroidal neovascularization: Secondary | ICD-10-CM | POA: Diagnosis not present

## 2021-01-18 MED ORDER — BEVACIZUMAB 2.5 MG/0.1ML IZ SOSY
2.5000 mg | PREFILLED_SYRINGE | INTRAVITREAL | Status: AC | PRN
  Administered 2021-01-18: 2.5 mg via INTRAVITREAL

## 2021-01-18 NOTE — Progress Notes (Signed)
01/18/2021     CHIEF COMPLAINT Patient presents for  Chief Complaint  Patient presents with   Macular Degeneration      HISTORY OF PRESENT ILLNESS: Briana Hines is a 65 y.o. female who presents to the clinic today for:   HPI   With history of active CNVM OD 6 weeks previous status post Avastin, repeat evaluation today possible injection Last edited by Edmon Crape, MD on 01/18/2021  4:09 PM.      Referring physician: Jerl Mina, MD [address] [facility] [place],  [place] [address]  HISTORICAL INFORMATION:   Selected notes from the MEDICAL RECORD NUMBER       CURRENT MEDICATIONS: No current outpatient medications on file. (Ophthalmic Drugs)   No current facility-administered medications for this visit. (Ophthalmic Drugs)   Current Outpatient Medications (Other)  Medication Sig   amLODipine (NORVASC) 5 MG tablet Take 5 mg by mouth every morning.    aspirin EC 81 MG tablet Take 81 mg by mouth daily.   Cyanocobalamin (B-12) 1000 MCG CAPS Take 1,000 mcg by mouth daily.    metoprolol succinate (TOPROL-XL) 25 MG 24 hr tablet Take 25 mg by mouth at bedtime.    Multiple Vitamin (MULTI-VITAMINS) TABS Take 1 tablet by mouth 3 (three) times a week.    Multiple Vitamins-Minerals (ICAPS AREDS 2 PO) Take 1 tablet by mouth 2 (two) times daily.    Omega-3 Fatty Acids (FISH OIL) 1200 MG CAPS Take 1,200 mg by mouth daily.    omeprazole (PRILOSEC) 40 MG capsule Take 1 capsule (40 mg total) by mouth daily.   No current facility-administered medications for this visit. (Other)      REVIEW OF SYSTEMS:    ALLERGIES No Known Allergies  PAST MEDICAL HISTORY Past Medical History:  Diagnosis Date   Complication of anesthesia    GERD (gastroesophageal reflux disease)    History of hiatal hernia    Hypertension    Macular degeneration    PONV (postoperative nausea and vomiting)    dry heaves in 2011 only   Past Surgical History:  Procedure Laterality Date    BREAST BIOPSY Left 1990's   benign   CATARACT EXTRACTION Bilateral    CATARACT EXTRACTION, BILATERAL  09/2017   HERNIA REPAIR  2010   hiatal, incisional   IRIDOTOMY / IRIDECTOMY     ROBOTIC ASSISTED LAPAROSCOPIC REPAIR OF PARAESOPHAGEAL HERNIA N/A 12/18/2017   Procedure: ATTEMPTED ROBOTIC ASSISTED LAPAROSCOPIC REPAIR OF PARAESOPHAGEAL HERNIA;  Surgeon: Leafy Ro, MD;  Location: [facility] ORS;  Service: General;  Laterality: N/A;   TONSILLECTOMY     age 23   VENTRAL HERNIA REPAIR  12/18/2017   Procedure: HERNIA REPAIR VENTRAL ADULT;  Surgeon: Leafy Ro, MD;  Location: [facility] ORS;  Service: General;;    FAMILY HISTORY Family History  Problem Relation Age of Onset   COPD Mother    Cancer Mother        not sure what type   Hypertension Mother    COPD Father    Heart attack Brother    High blood pressure Brother    Breast cancer Neg Hx     SOCIAL HISTORY Social History   Tobacco Use   Smoking status: Never   Smokeless tobacco: Never  Vaping Use   Vaping Use: Never used  Substance Use Topics   Alcohol use: Never   Drug use: Never         OPHTHALMIC EXAM:  Base Eye  Exam     Visual Acuity (ETDRS)       Right Left   Dist [place] 20/50 +2 20/20 -1         Tonometry (Tonopen, 4:06 PM)       Right Left   Pressure 14 19         Pupils       Pupils APD   Right PERRL None   Left PERRL None         Extraocular Movement       Right Left    Full, Ortho Full, Ortho         Neuro/Psych     Oriented x3: Yes   Mood/Affect: Normal         Dilation     Right eye: 1.0% Mydriacyl, 2.5% Phenylephrine @ 4:06 PM           Slit Lamp and Fundus Exam     External Exam       Right Left   External Normal Normal         Slit Lamp Exam       Right Left   Lids/Lashes Normal Normal   Conjunctiva/Sclera White and quiet White and quiet   Cornea Clear Clear   Anterior Chamber Deep and quiet Deep and quiet   Iris Round and reactive Round and  reactive   Lens Clear Clear   Anterior Vitreous Normal Normal            IMAGING AND PROCEDURES  Imaging and Procedures for 01/18/21  OCT, Retina - OU - Both Eyes       Right Eye Quality was good. Scan locations included subfoveal. Central Foveal Thickness: 231. Progression has been stable. Findings include abnormal foveal contour, cystoid macular edema, choroidal neovascular membrane, subretinal hyper-reflective material, subretinal fluid.   Left Eye Scan locations included subfoveal. Central Foveal Thickness: 261.   Notes OD with new onset subretinal fluid signs of CNVM With RPE detachment temporally, as of October 2022, now improved postinjection #1 with smaller pigment epithelial detachment and serous retinal detachment, yet still active  OS persistent unchanged subfoveal drusen, no sign of CNVM     Intravitreal Injection, Pharmacologic Agent - OD - Right Eye       Time Out 01/18/2021. 4:18 PM. Confirmed correct patient, procedure, site, and patient consented.   Anesthesia Subconjunctival anesthesia was used. Anesthetic medications included Lidocaine 4%.   Procedure Preparation included 5% betadine to ocular surface, 10% betadine to eyelids, Tobramycin 0.3%. A 30 gauge needle was used.   Injection: 2.5 mg bevacizumab 2.5 MG/0.1ML   Route: Intravitreal, Site: Right Eye   NDC: [phone], Lot: [id number]   Post-op Post injection exam found visual acuity of at least counting fingers. The patient tolerated the procedure well. There were no complications. The patient received written and verbal post procedure care education. Post injection medications included ocuflox.              ASSESSMENT/PLAN:  Stable central retinal vein occlusion of right eye No sign of recurrence of CRV O  Nonexudative age-related macular degeneration, bilateral, early dry stage No sign of CNVM OS  Exudative age-related macular degeneration of right eye with active choroidal  neovascularization (HCC) Improved macular anatomy 1 month post injection Avastin #1 repeat injection today for wet AMD     ICD-10-CM   1. Exudative age-related macular degeneration of right eye with active choroidal neovascularization (HCC)  H35.3211 OCT, Retina - OU - Both Eyes  Intravitreal Injection, Pharmacologic Agent - OD - Right Eye    bevacizumab (AVASTIN) SOSY 2.5 mg    2. Stable central retinal vein occlusion of right eye  H34.8112     3. Nonexudative age-related macular degeneration, bilateral, early dry stage  H35.3131       1.  OD new onset CNVM as of October 2022, improved postinjection #1 Avastin intravitreal repeat injection today and examination again in 1 month  2.  3.  Ophthalmic Meds Ordered this visit:  Meds ordered this encounter  Medications   bevacizumab (AVASTIN) SOSY 2.5 mg       Return in about 1 month (around 02/18/2021) for dilate, OD, AVASTIN OCT.  There are no Patient Instructions on file for this visit.   Explained the diagnoses, plan, and follow up with the patient and they expressed understanding.  Patient expressed understanding of the importance of proper follow up care.   Briana Hines M.D. Diseases & Surgery of the Retina and Vitreous [facility] 01/18/21     Abbreviations: M myopia (nearsighted); A astigmatism; H hyperopia (farsighted); P presbyopia; Mrx spectacle prescription;  CTL contact lenses; OD right eye; OS left eye; OU both eyes  XT exotropia; ET esotropia; PEK punctate epithelial keratitis; PEE punctate epithelial erosions; DES dry eye syndrome; MGD meibomian gland dysfunction; ATs artificial tears; PFAT's preservative free artificial tears; NSC nuclear sclerotic cataract; PSC posterior subcapsular cataract; ERM epi-retinal membrane; PVD posterior vitreous detachment; RD retinal detachment; DM diabetes mellitus; DR diabetic retinopathy; NPDR non-proliferative diabetic retinopathy; PDR proliferative diabetic  retinopathy; CSME clinically significant macular edema; DME diabetic macular edema; dbh dot blot hemorrhages; CWS cotton wool spot; POAG primary open angle glaucoma; C/D cup-to-disc ratio; HVF humphrey visual field; GVF goldmann visual field; OCT optical coherence tomography; IOP intraocular pressure; BRVO Branch retinal vein occlusion; CRVO central retinal vein occlusion; CRAO central retinal artery occlusion; BRAO branch retinal artery occlusion; RT retinal tear; SB scleral buckle; PPV pars plana vitrectomy; VH Vitreous hemorrhage; PRP panretinal laser photocoagulation; IVK intravitreal kenalog; VMT vitreomacular traction; MH Macular hole;  NVD neovascularization of the disc; NVE neovascularization elsewhere; AREDS age related eye disease study; ARMD age related macular degeneration; POAG primary open angle glaucoma; EBMD epithelial/anterior basement membrane dystrophy; ACIOL anterior chamber intraocular lens; IOL intraocular lens; PCIOL posterior chamber intraocular lens; Phaco/IOL phacoemulsification with intraocular lens placement; PRK photorefractive keratectomy; LASIK laser assisted in situ keratomileusis; HTN hypertension; DM diabetes mellitus; COPD chronic obstructive pulmonary disease

## 2021-01-18 NOTE — Assessment & Plan Note (Signed)
Improved macular anatomy 1 month post injection Avastin #1 repeat injection today for wet AMD

## 2021-01-18 NOTE — Assessment & Plan Note (Signed)
No sign of CNVM OS 

## 2021-01-18 NOTE — Assessment & Plan Note (Signed)
No sign of recurrence of CRV O

## 2021-01-19 ENCOUNTER — Encounter: Payer: Self-pay | Admitting: Surgery

## 2021-01-19 ENCOUNTER — Ambulatory Visit (INDEPENDENT_AMBULATORY_CARE_PROVIDER_SITE_OTHER): Payer: Managed Care, Other (non HMO) | Admitting: Surgery

## 2021-01-19 ENCOUNTER — Other Ambulatory Visit: Payer: Self-pay

## 2021-01-19 VITALS — BP 134/85 | HR 73 | Temp 98.8°F | Ht 67.0 in | Wt 196.6 lb

## 2021-01-19 DIAGNOSIS — K449 Diaphragmatic hernia without obstruction or gangrene: Secondary | ICD-10-CM | POA: Diagnosis not present

## 2021-01-19 NOTE — Patient Instructions (Addendum)
Dr.Pabon will talk with Dr.matthew Daphine Deutscher at [facility] surgery in [place].  Referral will be faxed to the above provider. Someone from their office will call you within 7-10 days to schedule an appointment.       Hernia, Adult   A hernia is the bulging of an organ or tissue through a weak spot in the muscles of the abdomen. Hernias develop most often near the belly button (navel) or the area where the leg meets the lower abdomen (groin). Common types of hernias include: Incisional hernia. This type bulges through a scar from an abdominal surgery. Umbilical hernia. This type develops near the navel. Inguinal hernia. This type develops in the groin or scrotum. Femoral hernia. This type develops below the groin, in the upper thigh area. Hiatal hernia. This type occurs when part of the stomach slides above the muscle that separates the abdomen from the chest (diaphragm). What are the causes? This condition may be caused by: Heavy lifting. Coughing over a long period of time. Straining to have a bowel movement. Constipation can lead to straining. An incision made during abdominal surgery. A physical problem that is present at birth (congenital defect). Being overweight or obese. Smoking. Excess fluid in the abdomen. Undescended testicles in males. What are the signs or symptoms? The main symptom is a skin-colored, rounded bulge in the area of the hernia. However, a bulge may not always be present. It may grow bigger or be more visible when you cough or strain (such as when lifting something heavy). A hernia that can be pushed back into the abdomen (is reducible) rarely causes pain. A hernia that cannot be pushed back into the abdomen (is incarcerated) may lose its blood supply (become strangulated). A hernia that is incarcerated may cause: Pain. Fever. Nausea and vomiting. Swelling. Constipation. How is this diagnosed? A hernia may be diagnosed based on: Your symptoms and  medical history. A physical exam. Your health care provider may ask you to cough or move in certain ways to see if the hernia becomes visible. Imaging tests, such as: X-rays. Ultrasound. CT scan. How is this treated? A hernia that is small and painless may not need to be treated. A hernia that is large or painful may be treated with surgery. Inguinal hernias may be treated with surgery to prevent incarceration or strangulation. Strangulated hernias are always treated with surgery because the strangulation causes a lack of blood supply to the trapped organ or tissue. Surgery to treat a hernia involves pushing the bulge back into place and repairing the weak area of the muscle or abdominal wall. Follow these instructions at home: Activity Avoid straining. Do not lift anything that is heavier than 10 lb (4.5 kg), or the limit that you are told, until your health care provider says that it is safe. When lifting heavy objects, lift with your leg muscles, not your back muscles. Preventing constipation Take actions to prevent constipation. Constipation leads to straining with bowel movements, which can make a hernia worse or cause a hernia repair to break down. Your health care provider may recommend that you take these actions to prevent or treat constipation: Drink enough fluid to keep your urine pale yellow. Take over-the-counter or prescription medicines. Eat foods that are high in fiber, such as beans, whole grains, and fresh fruits and vegetables. Limit foods that are high in fat and processed sugars, such as fried or sweet foods. General instructions When coughing, try to cough gently. You may try to push the  hernia back in place by very gently pressing on it while lying down. Do not try to force the bulge back in if it will not push in easily. If you are overweight, work with your health care provider to lose weight safely. Do not use any products that contain nicotine or tobacco. These  products include cigarettes, chewing tobacco, and vaping devices, such as e-cigarettes. If you need help quitting, ask your health care provider. If you are scheduled for hernia repair, watch your hernia for any changes in shape, size, or color. Tell your health care provider about any changes or new symptoms. Take over-the-counter and prescription medicines only as told by your health care provider. Keep all follow-up visits. This is important. Contact a health care provider if: You develop new pain, swelling, or redness around your hernia. You have signs of constipation, such as: Fewer bowel movements in a week than normal. Difficulty having a bowel movement. Stools that are dry, hard, or larger than normal. Get help right away if: You have a fever or chills. You have abdominal pain that gets worse. You feel nauseous or you vomit. You cannot push the hernia back in place by very gently pressing on it while lying down. Do not try to force the bulge back in if it will not go in easily. The hernia: Changes in shape, size, or color. Feels hard or tender. These symptoms may represent a serious problem that is an emergency. Do not wait to see if the symptoms will go away. Get medical help right away. Call your local emergency services (911 in the [place] Do not drive yourself to the hospital. Summary A hernia is the bulging of an organ or tissue through a weak spot in the muscles of the abdomen. The main symptom is a skin-colored bulge in the hernia area. However, a bulge may not always be present. It may grow bigger or more visible when you cough or strain (such as when having a bowel movement). A hernia that is small and painless may not need to be treated. A hernia that is large or painful may be treated with surgery. Surgery to treat a hernia involves pushing the bulge back into place and repairing the weak part of the abdomen. This information is not intended to replace advice given to you by  your health care provider. Make sure you discuss any questions you have with your health care provider. Document Revised: 09/08/2019 Document Reviewed: 09/08/2019 Elsevier Patient Education  2022 [employer].

## 2021-01-24 ENCOUNTER — Telehealth: Payer: Self-pay

## 2021-01-24 NOTE — Telephone Encounter (Signed)
Referral notes, demographic sheet faxed to Dr.Matthew [facility] surgery at this time.

## 2021-01-24 NOTE — Progress Notes (Signed)
Outpatient Surgical Follow Up  01/24/2021  Briana Hines is an 65 y.o. Hines.   Chief Complaint  Patient presents with   Follow-up    Hiatal hernia and Bilateral inguinal hernias    HPI: Briana Hines well-known to me with a history of Recurrent paraesophageal hernia repair with mesh 3 years ago by me.  At Same time Briana Hines did have a pyloroplasty due to chronic gastroparesis.  Briana Hines recently went to the emergency room with some chest pain and epigastric pain chest x-ray report shows some hiatal hernia.   More importantly patient is feeling well her gastric emptying is significantly improved.  And Briana Hines has no reflux.  Briana Hines says that Briana Hines has a total of 2 reflux episodes within the last 2 years. He did have a recent CT scan as part of the work-up showing evidence of a recurrent large paraesophageal hernia     Past Medical History:  Diagnosis Date   Complication of anesthesia    GERD (gastroesophageal reflux disease)    History of hiatal hernia    Hypertension    Macular degeneration    PONV (postoperative nausea and vomiting)    dry heaves in 2011 only    Past Surgical History:  Procedure Laterality Date   BREAST BIOPSY Left 1990's   benign   CATARACT EXTRACTION Bilateral    CATARACT EXTRACTION, BILATERAL  09/2017   HERNIA REPAIR  2010   hiatal, incisional   IRIDOTOMY / IRIDECTOMY     ROBOTIC ASSISTED LAPAROSCOPIC REPAIR OF PARAESOPHAGEAL HERNIA N/A 12/18/2017   Procedure: ATTEMPTED ROBOTIC ASSISTED LAPAROSCOPIC REPAIR OF PARAESOPHAGEAL HERNIA;  Surgeon: Leafy Ro, MD;  Location: [facility] ORS;  Service: General;  Laterality: N/A;   TONSILLECTOMY     age 7   VENTRAL HERNIA REPAIR  12/18/2017   Procedure: HERNIA REPAIR VENTRAL ADULT;  Surgeon: Leafy Ro, MD;  Location: [facility] ORS;  Service: General;;    Family History  Problem Relation Age of Onset   COPD Mother    Cancer Mother        not sure what type   Hypertension Mother    COPD Father    Heart attack Brother    High  blood pressure Brother    Breast cancer Neg Hx     Social History:  reports that Briana Hines has never smoked. Briana Hines has never used smokeless tobacco. Briana Hines reports that Briana Hines does not drink alcohol and does not use drugs.  Allergies: No Known Allergies  Medications reviewed.    ROS Full ROS performed and is otherwise negative other than what is stated in HPI   BP 134/85   Pulse 73   Temp 98.8 F (37.1 C) (Oral)   Ht 5\' 7"  (1.702 m)   Wt 196 lb 9.6 oz (89.2 kg)   SpO2 98%   BMI 30.79 kg/m   Physical Exam Vitals and nursing note reviewed. Exam conducted with a chaperone present.  Constitutional:      Appearance: Normal appearance. Briana Hines is not ill-appearing.  Pulmonary:     Effort: Pulmonary effort is normal.     Breath sounds: No stridor. No wheezing.  Abdominal:     General: Abdomen is flat. There is no distension.     Palpations: Abdomen is soft. There is no mass.     Tenderness: There is no abdominal tenderness. There is no rebound.     Hernia: No hernia is present.  Skin:    General: Skin is warm and dry.  Capillary Refill: Capillary refill takes less than 2 seconds.  Neurological:     General: No focal deficit present.     Mental Status: Briana Hines is alert and oriented to person, place, and time.  Psychiatric:        Mood and Affect: Mood normal.        Behavior: Behavior normal.        Thought Content: Thought content normal.        Judgment: Judgment normal.     Assessment/Plan:  Recurrent paraesophageal hernia.  At this time not symptomatic.  Briana Hines denies any reflux type symptoms Briana Hines denies any dysphagia.  I discussed with the patient detail about my concerns for a third operations related to the paraesophageal hernia.  Briana Hines is concerned about potential complications.  Based on my prior experience being her belly 3 years ago this had significant scarring tissue that required an open repair.  I discussed with her that I will like her to have a second opinion and go to a  tertiary center.  Briana Hines wishes not to go outside [facility].  I have recommended Dr. Daphine Deutscher from [facility] in [place].  He has agreed to see her in consultation. Not Sure whether or not  the benefits of a redo redo paraesophageal hernia repair will outweigh potential risks related to another re intervention We will arrange for referal. Please note that I spent at least 40 minutes in the encounter including coordination of her care, placing orders, personally reviewing the images and counseling the patient.  Sterling Big, MD [facility] General Surgeon

## 2021-01-31 ENCOUNTER — Telehealth: Payer: Self-pay

## 2021-01-31 NOTE — Telephone Encounter (Signed)
Patient seen Dr.Matthew Daphine Deutscher 01/26/2021.

## 2021-02-22 ENCOUNTER — Encounter (INDEPENDENT_AMBULATORY_CARE_PROVIDER_SITE_OTHER): Payer: Self-pay | Admitting: Ophthalmology

## 2021-02-22 ENCOUNTER — Ambulatory Visit (INDEPENDENT_AMBULATORY_CARE_PROVIDER_SITE_OTHER): Payer: 59 | Admitting: Ophthalmology

## 2021-02-22 ENCOUNTER — Other Ambulatory Visit: Payer: Self-pay

## 2021-02-22 DIAGNOSIS — H353211 Exudative age-related macular degeneration, right eye, with active choroidal neovascularization: Secondary | ICD-10-CM

## 2021-02-22 MED ORDER — BEVACIZUMAB 2.5 MG/0.1ML IZ SOSY
2.5000 mg | PREFILLED_SYRINGE | INTRAVITREAL | Status: AC | PRN
  Administered 2021-02-22: 2.5 mg via INTRAVITREAL

## 2021-02-22 NOTE — Assessment & Plan Note (Signed)
Vascularized PED OD temporally, vastly improved since onset of therapy yet still Active disease.

## 2021-02-22 NOTE — Progress Notes (Signed)
02/22/2021     CHIEF COMPLAINT Patient presents for  Chief Complaint  Patient presents with   Retina Follow Up   Follow-up wet AMD, with new onset findings as of October 2022 OD.  Preserved acuity since last visit December 2022  Today at 5-week follow-up OD   HISTORY OF PRESENT ILLNESS: Briana Hines is a 66 y.o. female who presents to the clinic today for:   HPI     Retina Follow Up           Diagnosis: Wet AMD   Laterality: right eye   Onset: 5 weeks ago   Severity: mild   Duration: 5 weeks   Course: stable         Comments   5 week fu OD and OCT and Avastin OD  Pt states VA OU stable since last visit. Pt denies FOL, floaters, or ocular pain OU.  Pt states, "It seems like the waviness in my right eye is much better than it was. It is still some wave but nothing like it was."        Last edited by Demetrios LollBusick, Erica L, COA on 02/22/2021  3:30 PM.      Referring physician: Jerl MinaHedrick, James, MD [address] [facility] [place] [id number]  HISTORICAL INFORMATION:   Selected notes from the MEDICAL RECORD NUMBER       CURRENT MEDICATIONS: No current outpatient medications on file. (Ophthalmic Drugs)   No current facility-administered medications for this visit. (Ophthalmic Drugs)   Current Outpatient Medications (Other)  Medication Sig   aspirin EC 81 MG tablet Take 81 mg by mouth daily.   Cyanocobalamin (B-12) 1000 MCG CAPS Take 1,000 mcg by mouth daily.    metoprolol succinate (TOPROL-XL) 25 MG 24 hr tablet Take 25 mg by mouth at bedtime.    Multiple Vitamin (MULTI-VITAMINS) TABS Take 1 tablet by mouth 3 (three) times a week.    Multiple Vitamins-Minerals (ICAPS AREDS 2 PO) Take 1 tablet by mouth 2 (two) times daily.    Omega-3 Fatty Acids (FISH OIL) 1200 MG CAPS Take 1,200 mg by mouth daily.    omeprazole (PRILOSEC) 40 MG capsule Take 1 capsule (40 mg total) by mouth daily.   No current facility-administered medications for this visit.  (Other)      REVIEW OF SYSTEMS:    ALLERGIES No Known Allergies  PAST MEDICAL HISTORY Past Medical History:  Diagnosis Date   Complication of anesthesia    GERD (gastroesophageal reflux disease)    History of hiatal hernia    Hypertension    Macular degeneration    PONV (postoperative nausea and vomiting)    dry heaves in 2011 only   Past Surgical History:  Procedure Laterality Date   BREAST BIOPSY Left 1990's   benign   CATARACT EXTRACTION Bilateral    CATARACT EXTRACTION, BILATERAL  09/2017   HERNIA REPAIR  2010   hiatal, incisional   IRIDOTOMY / IRIDECTOMY     ROBOTIC ASSISTED LAPAROSCOPIC REPAIR OF PARAESOPHAGEAL HERNIA N/A 12/18/2017   Procedure: ATTEMPTED ROBOTIC ASSISTED LAPAROSCOPIC REPAIR OF PARAESOPHAGEAL HERNIA;  Surgeon: Leafy RoPabon, Diego F, MD;  Location: [facility] ORS;  Service: General;  Laterality: N/A;   TONSILLECTOMY     age 66   VENTRAL HERNIA REPAIR  12/18/2017   Procedure: HERNIA REPAIR VENTRAL ADULT;  Surgeon: Leafy RoPabon, Diego F, MD;  Location: [facility] ORS;  Service: General;;    FAMILY HISTORY Family History  Problem Relation Age of  Onset   COPD Mother    Cancer Mother        not sure what type   Hypertension Mother    COPD Father    Heart attack Brother    High blood pressure Brother    Breast cancer Neg Hx     SOCIAL HISTORY Social History   Tobacco Use   Smoking status: Never   Smokeless tobacco: Never  Vaping Use   Vaping Use: Never used  Substance Use Topics   Alcohol use: Never   Drug use: Never         OPHTHALMIC EXAM:  Base Eye Exam     Visual Acuity (ETDRS)       Right Left   Dist [place] 20/60 20/25   Dist ph [place] 20/30     Correction: Glasses         Tonometry (Tonopen, 3:33 PM)       Right Left   Pressure 12 10         Pupils       Pupils APD   Right PERRL None   Left PERRL None         Visual Fields (Counting fingers)       Left Right    Full Full         Extraocular Movement       Right Left     Full, Ortho Full, Ortho         Neuro/Psych     Oriented x3: Yes   Mood/Affect: Normal         Dilation     Right eye: 1.0% Mydriacyl, 2.5% Phenylephrine @ 3:33 PM           Slit Lamp and Fundus Exam     External Exam       Right Left   External Normal Normal         Slit Lamp Exam       Right Left   Lids/Lashes Normal Normal   Conjunctiva/Sclera White and quiet White and quiet   Cornea Clear Clear   Anterior Chamber Deep and quiet Deep and quiet   Iris Round and reactive Round and reactive   Lens Clear Clear   Anterior Vitreous Normal Normal         Fundus Exam       Right Left   Posterior Vitreous Normal    Disc Normal    Macula Normal    Vessels Normal    Periphery Normal             IMAGING AND PROCEDURES  Imaging and Procedures for 02/22/21  OCT, Retina - OU - Both Eyes       Right Eye Quality was good. Scan locations included subfoveal. Central Foveal Thickness: 225. Progression has been stable. Findings include abnormal foveal contour, cystoid macular edema, choroidal neovascular membrane, subretinal hyper-reflective material, subretinal fluid.   Left Eye Scan locations included subfoveal. Central Foveal Thickness: 259.   Notes OD with recent onset subretinal fluid signs of CNVM With RPE detachment temporally, as of October 2022, now improved postinjection of Avastin with smaller pigment epithelial detachment and serous retinal detachment, yet still active at 5-week interval  OS persistent unchanged subfoveal drusen, no sign of CNVM     Intravitreal Injection, Pharmacologic Agent - OD - Right Eye       Time Out 02/22/2021. 4:11 PM. Confirmed correct patient, procedure, site, and patient consented.   Anesthesia Subconjunctival anesthesia was  used. Anesthetic medications included Lidocaine 4%.   Procedure Preparation included 5% betadine to ocular surface, 10% betadine to eyelids, Tobramycin 0.3%. A 30 gauge needle was  used.   Injection: 2.5 mg bevacizumab 2.5 MG/0.1ML   Route: Intravitreal, Site: Right Eye   NDC: [phone], Lot: [id number]   Post-op Post injection exam found visual acuity of at least counting fingers. The patient tolerated the procedure well. There were no complications. The patient received written and verbal post procedure care education. Post injection medications included ocuflox.              ASSESSMENT/PLAN:  Exudative age-related macular degeneration of right eye with active choroidal neovascularization (HCC) Vascularized PED OD temporally, vastly improved since onset of therapy yet still Active disease.     ICD-10-CM   1. Exudative age-related macular degeneration of right eye with active choroidal neovascularization (HCC)  H35.3211 OCT, Retina - OU - Both Eyes    Intravitreal Injection, Pharmacologic Agent - OD - Right Eye    bevacizumab (AVASTIN) SOSY 2.5 mg      1.  OD vastly improved overall yet still active vascularized PED temporally.  Less subretinal fluid.  Preserved acuity.  At 5-week interval.  Repeat injection Avastin today follow-up again in 5 weeks 2.  3.  Ophthalmic Meds Ordered this visit:  Meds ordered this encounter  Medications   bevacizumab (AVASTIN) SOSY 2.5 mg       Return in about 5 weeks (around 03/29/2021) for dilate, OD, AVASTIN OCT.  There are no Patient Instructions on file for this visit.   Explained the diagnoses, plan, and follow up with the patient and they expressed understanding.  Patient expressed understanding of the importance of proper follow up care.   Alford Highland Britanni Yarde M.D. Diseases & Surgery of the Retina and Vitreous [facility] 02/22/21     Abbreviations: M myopia (nearsighted); A astigmatism; H hyperopia (farsighted); P presbyopia; Mrx spectacle prescription;  CTL contact lenses; OD right eye; OS left eye; OU both eyes  XT exotropia; ET esotropia; PEK punctate epithelial keratitis; PEE  punctate epithelial erosions; DES dry eye syndrome; MGD meibomian gland dysfunction; ATs artificial tears; PFAT's preservative free artificial tears; NSC nuclear sclerotic cataract; PSC posterior subcapsular cataract; ERM epi-retinal membrane; PVD posterior vitreous detachment; RD retinal detachment; DM diabetes mellitus; DR diabetic retinopathy; NPDR non-proliferative diabetic retinopathy; PDR proliferative diabetic retinopathy; CSME clinically significant macular edema; DME diabetic macular edema; dbh dot blot hemorrhages; CWS cotton wool spot; POAG primary open angle glaucoma; C/D cup-to-disc ratio; HVF humphrey visual field; GVF goldmann visual field; OCT optical coherence tomography; IOP intraocular pressure; BRVO Branch retinal vein occlusion; CRVO central retinal vein occlusion; CRAO central retinal artery occlusion; BRAO branch retinal artery occlusion; RT retinal tear; SB scleral buckle; PPV pars plana vitrectomy; VH Vitreous hemorrhage; PRP panretinal laser photocoagulation; IVK intravitreal kenalog; VMT vitreomacular traction; MH Macular hole;  NVD neovascularization of the disc; NVE neovascularization elsewhere; AREDS age related eye disease study; ARMD age related macular degeneration; POAG primary open angle glaucoma; EBMD epithelial/anterior basement membrane dystrophy; ACIOL anterior chamber intraocular lens; IOL intraocular lens; PCIOL posterior chamber intraocular lens; Phaco/IOL phacoemulsification with intraocular lens placement; PRK photorefractive keratectomy; LASIK laser assisted in situ keratomileusis; HTN hypertension; DM diabetes mellitus; COPD chronic obstructive pulmonary disease

## 2021-03-29 ENCOUNTER — Encounter (INDEPENDENT_AMBULATORY_CARE_PROVIDER_SITE_OTHER): Payer: 59 | Admitting: Ophthalmology

## 2021-03-29 ENCOUNTER — Encounter (INDEPENDENT_AMBULATORY_CARE_PROVIDER_SITE_OTHER): Payer: Self-pay | Admitting: Ophthalmology

## 2021-03-29 ENCOUNTER — Ambulatory Visit (INDEPENDENT_AMBULATORY_CARE_PROVIDER_SITE_OTHER): Payer: 59 | Admitting: Ophthalmology

## 2021-03-29 ENCOUNTER — Other Ambulatory Visit: Payer: Self-pay

## 2021-03-29 DIAGNOSIS — H353211 Exudative age-related macular degeneration, right eye, with active choroidal neovascularization: Secondary | ICD-10-CM

## 2021-03-29 DIAGNOSIS — H348112 Central retinal vein occlusion, right eye, stable: Secondary | ICD-10-CM

## 2021-03-29 MED ORDER — BEVACIZUMAB 2.5 MG/0.1ML IZ SOSY
2.5000 mg | PREFILLED_SYRINGE | INTRAVITREAL | Status: AC | PRN
  Administered 2021-03-29: 2.5 mg via INTRAVITREAL

## 2021-03-29 NOTE — Progress Notes (Signed)
03/29/2021     CHIEF COMPLAINT Patient presents for  Chief Complaint  Patient presents with   Macular Degeneration      HISTORY OF PRESENT ILLNESS: Briana Hines is a 66 y.o. female who presents to the clinic today for:   HPI   5 week fu OD Avastin OD, Oct. Patient states vision is stable and unchanged since last visit. Denies any new floaters or FOL.  Last edited by Nelva Nay on 03/29/2021  3:55 PM.      Referring physician: Jerl Mina, MD [address] [facility] [place],  [place] [address]  HISTORICAL INFORMATION:   Selected notes from the MEDICAL RECORD NUMBER       CURRENT MEDICATIONS: No current outpatient medications on file. (Ophthalmic Drugs)   No current facility-administered medications for this visit. (Ophthalmic Drugs)   Current Outpatient Medications (Other)  Medication Sig   aspirin EC 81 MG tablet Take 81 mg by mouth daily.   Cyanocobalamin (B-12) 1000 MCG CAPS Take 1,000 mcg by mouth daily.    metoprolol succinate (TOPROL-XL) 25 MG 24 hr tablet Take 25 mg by mouth at bedtime.    Multiple Vitamin (MULTI-VITAMINS) TABS Take 1 tablet by mouth 3 (three) times a week.    Multiple Vitamins-Minerals (ICAPS AREDS 2 PO) Take 1 tablet by mouth 2 (two) times daily.    Omega-3 Fatty Acids (FISH OIL) 1200 MG CAPS Take 1,200 mg by mouth daily.    omeprazole (PRILOSEC) 40 MG capsule Take 1 capsule (40 mg total) by mouth daily.   No current facility-administered medications for this visit. (Other)      REVIEW OF SYSTEMS: ROS   Negative for: Constitutional, Gastrointestinal, Neurological, Skin, Genitourinary, Musculoskeletal, HENT, Endocrine, Cardiovascular, Eyes, Respiratory, Psychiatric, Allergic/Imm, Heme/Lymph Last edited by Edmon Crape, MD on 03/29/2021  4:01 PM.       ALLERGIES No Known Allergies  PAST MEDICAL HISTORY Past Medical History:  Diagnosis Date   Complication of anesthesia    GERD (gastroesophageal reflux  disease)    History of hiatal hernia    Hypertension    Macular degeneration    PONV (postoperative nausea and vomiting)    dry heaves in 2011 only   Past Surgical History:  Procedure Laterality Date   BREAST BIOPSY Left 1990's   benign   CATARACT EXTRACTION Bilateral    CATARACT EXTRACTION, BILATERAL  09/2017   HERNIA REPAIR  2010   hiatal, incisional   IRIDOTOMY / IRIDECTOMY     ROBOTIC ASSISTED LAPAROSCOPIC REPAIR OF PARAESOPHAGEAL HERNIA N/A 12/18/2017   Procedure: ATTEMPTED ROBOTIC ASSISTED LAPAROSCOPIC REPAIR OF PARAESOPHAGEAL HERNIA;  Surgeon: Leafy Ro, MD;  Location: [facility] ORS;  Service: General;  Laterality: N/A;   TONSILLECTOMY     age 66   VENTRAL HERNIA REPAIR  12/18/2017   Procedure: HERNIA REPAIR VENTRAL ADULT;  Surgeon: Leafy Ro, MD;  Location: [facility] ORS;  Service: General;;    FAMILY HISTORY Family History  Problem Relation Age of Onset   COPD Mother    Cancer Mother        not sure what type   Hypertension Mother    COPD Father    Heart attack Brother    High blood pressure Brother    Breast cancer Neg Hx     SOCIAL HISTORY Social History   Tobacco Use   Smoking status: Never   Smokeless tobacco: Never  Vaping Use   Vaping Use: Never used  Substance  Use Topics   Alcohol use: Never   Drug use: Never         OPHTHALMIC EXAM:  Base Eye Exam     Visual Acuity (ETDRS)       Right Left   Dist [place] 20/40 -1 20/25   Dist ph [place] NI          Tonometry (Tonopen, 3:49 PM)       Right Left   Pressure 14 16         Pupils       Pupils Dark Light Shape React APD   Right PERRL 6 5 Round Brisk None   Left PERRL 6 5 Round Brisk None         Visual Fields (Counting fingers)       Left Right    Full Full         Extraocular Movement       Right Left    Full Full         Neuro/Psych     Oriented x3: Yes   Mood/Affect: Normal         Dilation     Right eye: 1.0% Mydriacyl, 2.5% Phenylephrine @ 3:49 PM            Slit Lamp and Fundus Exam     External Exam       Right Left   External Normal Normal         Slit Lamp Exam       Right Left   Lids/Lashes Normal Normal   Conjunctiva/Sclera White and quiet White and quiet   Cornea Clear Clear   Anterior Chamber Deep and quiet Deep and quiet   Iris Round and reactive Round and reactive   Lens Clear Clear   Anterior Vitreous Normal Normal         Fundus Exam       Right Left   Posterior Vitreous Normal    Disc Normal    Macula Normal    Vessels Normal    Periphery Normal             IMAGING AND PROCEDURES  Imaging and Procedures for 03/29/21  Intravitreal Injection, Pharmacologic Agent - OD - Right Eye       Time Out 03/29/2021. 4:02 PM. Confirmed correct patient, procedure, site, and patient consented.   Anesthesia Subconjunctival anesthesia was used. Anesthetic medications included Lidocaine 4%.   Procedure Preparation included 5% betadine to ocular surface, 10% betadine to eyelids, Tobramycin 0.3%. A 30 gauge needle was used.   Injection: 2.5 mg bevacizumab 2.5 MG/0.1ML   Route: Intravitreal, Site: Right Eye   NDC: [phone], Lot: [id number]   Post-op Post injection exam found visual acuity of at least counting fingers. The patient tolerated the procedure well. There were no complications. The patient received written and verbal post procedure care education. Post injection medications included ocuflox.      OCT, Retina - OU - Both Eyes       Right Eye Quality was good. Scan locations included subfoveal. Central Foveal Thickness: 223. Progression has been stable. Findings include abnormal foveal contour, cystoid macular edema, choroidal neovascular membrane, subretinal hyper-reflective material, subretinal fluid.   Left Eye Scan locations included subfoveal. Central Foveal Thickness: 258.   Notes OD with recent onset subretinal fluid signs of CNVM With RPE detachment temporally, as of October  2022, now improved postinjection of Avastin with smaller pigment epithelial detachment and serous retinal detachment, yet still active  at 5-week interval  OS persistent unchanged subfoveal drusen, no sign of CNVM             ASSESSMENT/PLAN:  Exudative age-related macular degeneration of right eye with active choroidal neovascularization (HCC) OD, with serous retinal detachment and RPE detachment temporally control but not resolved on Avastin.  Onset some 4 months prior.  Repeat Avastin today we will try intravitreal Eylea next  Stable central retinal vein occlusion of right eye No signs of recurrence of CRV O     ICD-10-CM   1. Exudative age-related macular degeneration of right eye with active choroidal neovascularization (HCC)  H35.3211 Intravitreal Injection, Pharmacologic Agent - OD - Right Eye    OCT, Retina - OU - Both Eyes    bevacizumab (AVASTIN) SOSY 2.5 mg    2. Stable central retinal vein occlusion of right eye  H34.8112       1.  OD, wet AMD temporal to FAZ, much less active but still active serous RPE detachment.  Repeat Avastin today after 4 months of therapy, may consider change to [facility] next  2.  Patient will apply to good days charitable assistance  3.  Ophthalmic Meds Ordered this visit:  Meds ordered this encounter  Medications   bevacizumab (AVASTIN) SOSY 2.5 mg       Return in about 5 weeks (around 05/03/2021) for dilate, OD, EYLEA OCT,,, a change,,.  There are no Patient Instructions on file for this visit.   Explained the diagnoses, plan, and follow up with the patient and they expressed understanding.  Patient expressed understanding of the importance of proper follow up care.   Alford Highland Dierdre Mccalip M.D. Diseases & Surgery of the Retina and Vitreous [facility] 03/29/21     Abbreviations: M myopia (nearsighted); A astigmatism; H hyperopia (farsighted); P presbyopia; Mrx spectacle prescription;  CTL contact lenses; OD right  eye; OS left eye; OU both eyes  XT exotropia; ET esotropia; PEK punctate epithelial keratitis; PEE punctate epithelial erosions; DES dry eye syndrome; MGD meibomian gland dysfunction; ATs artificial tears; PFAT's preservative free artificial tears; NSC nuclear sclerotic cataract; PSC posterior subcapsular cataract; ERM epi-retinal membrane; PVD posterior vitreous detachment; RD retinal detachment; DM diabetes mellitus; DR diabetic retinopathy; NPDR non-proliferative diabetic retinopathy; PDR proliferative diabetic retinopathy; CSME clinically significant macular edema; DME diabetic macular edema; dbh dot blot hemorrhages; CWS cotton wool spot; POAG primary open angle glaucoma; C/D cup-to-disc ratio; HVF humphrey visual field; GVF goldmann visual field; OCT optical coherence tomography; IOP intraocular pressure; BRVO Branch retinal vein occlusion; CRVO central retinal vein occlusion; CRAO central retinal artery occlusion; BRAO branch retinal artery occlusion; RT retinal tear; SB scleral buckle; PPV pars plana vitrectomy; VH Vitreous hemorrhage; PRP panretinal laser photocoagulation; IVK intravitreal kenalog; VMT vitreomacular traction; MH Macular hole;  NVD neovascularization of the disc; NVE neovascularization elsewhere; AREDS age related eye disease study; ARMD age related macular degeneration; POAG primary open angle glaucoma; EBMD epithelial/anterior basement membrane dystrophy; ACIOL anterior chamber intraocular lens; IOL intraocular lens; PCIOL posterior chamber intraocular lens; Phaco/IOL phacoemulsification with intraocular lens placement; PRK photorefractive keratectomy; LASIK laser assisted in situ keratomileusis; HTN hypertension; DM diabetes mellitus; COPD chronic obstructive pulmonary disease

## 2021-03-29 NOTE — Assessment & Plan Note (Signed)
OD, with serous retinal detachment and RPE detachment temporally control but not resolved on Avastin.  Onset some 4 months prior.  Repeat Avastin today we will try intravitreal Eylea next

## 2021-03-29 NOTE — Assessment & Plan Note (Signed)
No signs of recurrence of CRV O

## 2021-05-03 ENCOUNTER — Ambulatory Visit (INDEPENDENT_AMBULATORY_CARE_PROVIDER_SITE_OTHER): Payer: 59 | Admitting: Ophthalmology

## 2021-05-03 ENCOUNTER — Encounter (INDEPENDENT_AMBULATORY_CARE_PROVIDER_SITE_OTHER): Payer: Self-pay | Admitting: Ophthalmology

## 2021-05-03 ENCOUNTER — Other Ambulatory Visit: Payer: Self-pay

## 2021-05-03 DIAGNOSIS — H353211 Exudative age-related macular degeneration, right eye, with active choroidal neovascularization: Secondary | ICD-10-CM

## 2021-05-03 MED ORDER — AFLIBERCEPT 2MG/0.05ML IZ SOLN FOR KALEIDOSCOPE
2.0000 mg | INTRAVITREAL | Status: AC | PRN
  Administered 2021-05-03: 2 mg via INTRAVITREAL

## 2021-05-03 NOTE — Progress Notes (Signed)
? ? ?05/03/2021 ? ?  ? ?CHIEF COMPLAINT ?Patient presents for  ?Chief Complaint  ?Patient presents with  ? Macular Degeneration  ? ? ? ? ?HISTORY OF PRESENT ILLNESS: ?Briana Hines is a 66 y.o. female who presents to the clinic today for:  ? ? ? ?Referring physician: ?Jerl MinaHedrick, James, MD ?[address] ?[facility] [place] ?[place],  [place] [id number] ? ?HISTORICAL INFORMATION:  ? ?Selected notes from the MEDICAL RECORD NUMBER ?  ?   ? ?CURRENT MEDICATIONS: ?No current outpatient medications on file. (Ophthalmic Drugs)  ? ?No current facility-administered medications for this visit. (Ophthalmic Drugs)  ? ?Current Outpatient Medications (Other)  ?Medication Sig  ? aspirin EC 81 MG tablet Take 81 mg by mouth daily.  ? Cyanocobalamin (B-12) 1000 MCG CAPS Take 1,000 mcg by mouth daily.   ? metoprolol succinate (TOPROL-XL) 25 MG 24 hr tablet Take 25 mg by mouth at bedtime.   ? Multiple Vitamin (MULTI-VITAMINS) TABS Take 1 tablet by mouth 3 (three) times a week.   ? Multiple Vitamins-Minerals (ICAPS AREDS 2 PO) Take 1 tablet by mouth 2 (two) times daily.   ? Omega-3 Fatty Acids (FISH OIL) 1200 MG CAPS Take 1,200 mg by mouth daily.   ? omeprazole (PRILOSEC) 40 MG capsule Take 1 capsule (40 mg total) by mouth daily.  ? ?No current facility-administered medications for this visit. (Other)  ? ? ? ? ?REVIEW OF SYSTEMS: ?ROS   ?Negative for: Constitutional, Gastrointestinal, Neurological, Skin, Genitourinary, Musculoskeletal, HENT, Endocrine, Cardiovascular, Eyes, Respiratory, Psychiatric, Allergic/Imm, Heme/Lymph ?Last edited by Edmon Crapeankin, Karren Newland A, MD on 05/03/2021  4:59 PM.  ?  ? ? ? ?ALLERGIES ?No Known Allergies ? ?PAST MEDICAL HISTORY ?Past Medical History:  ?Diagnosis Date  ? Complication of anesthesia   ? GERD (gastroesophageal reflux disease)   ? History of hiatal hernia   ? Hypertension   ? Macular degeneration   ? PONV (postoperative nausea and vomiting)   ? dry heaves in 2011 only  ? ?Past Surgical History:  ?Procedure Laterality  Date  ? BREAST BIOPSY Left 1990's  ? benign  ? CATARACT EXTRACTION Bilateral   ? CATARACT EXTRACTION, BILATERAL  09/2017  ? HERNIA REPAIR  2010  ? hiatal, incisional  ? IRIDOTOMY / IRIDECTOMY    ? ROBOTIC ASSISTED LAPAROSCOPIC REPAIR OF PARAESOPHAGEAL HERNIA N/A 12/18/2017  ? Procedure: ATTEMPTED ROBOTIC ASSISTED LAPAROSCOPIC REPAIR OF PARAESOPHAGEAL HERNIA;  Surgeon: Leafy RoPabon, Diego F, MD;  Location: [facility] ORS;  Service: General;  Laterality: N/A;  ? TONSILLECTOMY    ? age 28  ? VENTRAL HERNIA REPAIR  12/18/2017  ? Procedure: HERNIA REPAIR VENTRAL ADULT;  Surgeon: Leafy RoPabon, Diego F, MD;  Location: [facility] ORS;  Service: General;;  ? ? ?FAMILY HISTORY ?Family History  ?Problem Relation Age of Onset  ? COPD Mother   ? Cancer Mother   ?     not sure what type  ? Hypertension Mother   ? COPD Father   ? Heart attack Brother   ? High blood pressure Brother   ? Breast cancer Neg Hx   ? ? ?SOCIAL HISTORY ?Social History  ? ?Tobacco Use  ? Smoking status: Never  ? Smokeless tobacco: Never  ?Vaping Use  ? Vaping Use: Never used  ?Substance Use Topics  ? Alcohol use: Never  ? Drug use: Never  ? ?  ? ?  ? ?OPHTHALMIC EXAM: ? ?Base Eye Exam   ? ? Visual Acuity (ETDRS)   ? ?   Right Left  ?  Dist [place] 20/40 -1 20/30 -2  ? Dist ph [place] NI   ? ?  ?  ? ? Tonometry (Tonopen, 4:30 PM)   ? ?   Right Left  ? Pressure 10 12  ? ?  ?  ? ? Neuro/Psych   ? ? Oriented x3: Yes  ? Mood/Affect: Normal  ? ?  ?  ? ? Dilation   ? ? Right eye: 1.0% Mydriacyl, 2.5% Phenylephrine @ 4:30 PM  ? ?  ?  ? ?  ? ?Slit Lamp and Fundus Exam   ? ? External Exam   ? ?   Right Left  ? External Normal Normal  ? ?  ?  ? ? Slit Lamp Exam   ? ?   Right Left  ? Lids/Lashes Normal Normal  ? Conjunctiva/Sclera White and quiet White and quiet  ? Cornea Clear Clear  ? Anterior Chamber Deep and quiet Deep and quiet  ? Iris Round and reactive Round and reactive  ? Lens Centered posterior chamber intraocular lens Centered posterior chamber intraocular lens  ? Anterior Vitreous Normal  Normal  ? ?  ?  ? ? Fundus Exam   ? ?   Right Left  ? Posterior Vitreous Posterior vitreous detachment, Centered posterior chamber intraocular lens   ? Disc Normal   ? C/D Ratio 0.6   ? Macula Intermediate soft drusen, Macular thickening temporally   ? Vessels Normal   ? Periphery Normal   ? ?  ?  ? ?  ? ? ?IMAGING AND PROCEDURES  ?Imaging and Procedures for 05/03/21 ? ?OCT, Retina - OU - Both Eyes   ? ?   ?Right Eye ?Quality was good. Scan locations included subfoveal. Central Foveal Thickness: 233. Progression has been stable. Findings include abnormal foveal contour, cystoid macular edema, choroidal neovascular membrane, subretinal hyper-reflective material, subretinal fluid.  ? ?Left Eye ?Scan locations included subfoveal. Central Foveal Thickness: 265. Progression has been stable. Findings include abnormal foveal contour, retinal drusen , no IRF.  ? ?Notes ?OD with recent onset subretinal fluid signs of CNVM With RPE detachment temporally, as of October 2022, now improved postinjection of Avastin with smaller pigment epithelial detachment and serous retinal detachment, yet still active at 5-week interval ? ?OS persistent unchanged subfoveal drusen, no sign of CNVM ? ?  ? ?Intravitreal Injection, Pharmacologic Agent - OD - Right Eye   ? ?   ?Time Out ?05/03/2021. 5:03 PM. Confirmed correct patient, procedure, site, and patient consented.  ? ?Anesthesia ?Subconjunctival anesthesia was used. Anesthetic medications included Lidocaine 4%.  ? ?Procedure ?Preparation included 5% betadine to ocular surface, 10% betadine to eyelids, Tobramycin 0.3%. A 30 gauge needle was used.  ? ?Injection: ?2 mg aflibercept 2 MG/0.05ML ?  Route: Intravitreal, Site: Right Eye ?  NDC: [id number], Lot: [id number], Waste: 0 mL  ? ?Post-op ?Post injection exam found visual acuity of at least counting fingers. The patient tolerated the procedure well. There were no complications. The patient received written and verbal post procedure care  education. Post injection medications included ocuflox.  ? ?  ? ? ?  ?  ? ?  ?ASSESSMENT/PLAN: ? ?Exudative age-related macular degeneration of right eye with active choroidal neovascularization (HCC) ?Recent onset, now at 5 months post onset therapy, will continue treatment as lesion is now localized temporal to FAZ.  Currently follow-up interval is existing at 5 weeks on Eylea repeat injection today and follow-up next in 6 weeks  ? ?  ICD-10-CM   ?  1. Exudative age-related macular degeneration of right eye with active choroidal neovascularization (HCC)  H35.3211 OCT, Retina - OU - Both Eyes  ?  Intravitreal Injection, Pharmacologic Agent - OD - Right Eye  ?  aflibercept (EYLEA) SOLN 2 mg  ?  ? ? ?1 OD, with recent onset vascularized PED, wet AMD, with improved macular findings and stability of acuity currently 5 weeks post most recent Eylea.  We will repeat injection today and extend interval examination next to 6 weeks ? ?2. ? ?3. ? ?Ophthalmic Meds Ordered this visit:  ?Meds ordered this encounter  ?Medications  ? aflibercept (EYLEA) SOLN 2 mg  ? ? ?  ? ?Return in about 6 weeks (around 06/14/2021) for dilate, OD, EYLEA OCT. ? ?There are no Patient Instructions on file for this visit. ? ? ?Explained the diagnoses, plan, and follow up with the patient and they expressed understanding.  Patient expressed understanding of the importance of proper follow up care.  ? ?Alford Highland. Sayde Lish M.D. ?Diseases & Surgery of the Retina and Vitreous ?[facility] ?05/03/21 ? ? ? ? ?Abbreviations: ?M myopia (nearsighted); A astigmatism; H hyperopia (farsighted); P presbyopia; Mrx spectacle prescription;  CTL contact lenses; OD right eye; OS left eye; OU both eyes  XT exotropia; ET esotropia; PEK punctate epithelial keratitis; PEE punctate epithelial erosions; DES dry eye syndrome; MGD meibomian gland dysfunction; ATs artificial tears; PFAT's preservative free artificial tears; NSC nuclear sclerotic cataract; PSC posterior  subcapsular cataract; ERM epi-retinal membrane; PVD posterior vitreous detachment; RD retinal detachment; DM diabetes mellitus; DR diabetic retinopathy; NPDR non-proliferative diabetic retinopathy; PDR pr

## 2021-05-03 NOTE — Assessment & Plan Note (Signed)
Recent onset, now at 5 months post onset therapy, will continue treatment as lesion is now localized temporal to [place].  Currently follow-up interval is existing at 5 weeks on Eylea repeat injection today and follow-up next in 6 weeks ?

## 2021-05-13 ENCOUNTER — Encounter: Payer: Self-pay | Admitting: *Deleted

## 2021-05-16 ENCOUNTER — Encounter: Payer: Self-pay | Admitting: Gastroenterology

## 2021-05-16 ENCOUNTER — Ambulatory Visit
Admission: RE | Admit: 2021-05-16 | Discharge: 2021-05-16 | Disposition: A | Discharge status: Home / Self Care | Payer: 59 | Attending: Gastroenterology | Admitting: Gastroenterology

## 2021-05-16 ENCOUNTER — Ambulatory Visit: Payer: 59 | Admitting: Anesthesiology

## 2021-05-16 ENCOUNTER — Encounter
Admission: RE | Disposition: A | Discharge status: Home / Self Care | Payer: Self-pay | Source: Home / Self Care | Attending: Gastroenterology

## 2021-05-16 DIAGNOSIS — D649 Anemia, unspecified: Secondary | ICD-10-CM | POA: Diagnosis not present

## 2021-05-16 DIAGNOSIS — I10 Essential (primary) hypertension: Secondary | ICD-10-CM | POA: Insufficient documentation

## 2021-05-16 DIAGNOSIS — K449 Diaphragmatic hernia without obstruction or gangrene: Secondary | ICD-10-CM | POA: Insufficient documentation

## 2021-05-16 DIAGNOSIS — K5730 Diverticulosis of large intestine without perforation or abscess without bleeding: Secondary | ICD-10-CM | POA: Insufficient documentation

## 2021-05-16 DIAGNOSIS — K219 Gastro-esophageal reflux disease without esophagitis: Secondary | ICD-10-CM | POA: Diagnosis not present

## 2021-05-16 DIAGNOSIS — Z1211 Encounter for screening for malignant neoplasm of colon: Secondary | ICD-10-CM | POA: Insufficient documentation

## 2021-05-16 DIAGNOSIS — Z86010 Personal history of colonic polyps: Secondary | ICD-10-CM | POA: Diagnosis not present

## 2021-05-16 DIAGNOSIS — D759 Disease of blood and blood-forming organs, unspecified: Secondary | ICD-10-CM | POA: Insufficient documentation

## 2021-05-16 HISTORY — PX: COLONOSCOPY WITH PROPOFOL: SHX5780

## 2021-05-16 HISTORY — DX: Central retinal vein occlusion, unspecified eye, stable: H34.8192

## 2021-05-16 HISTORY — DX: Unspecified glaucoma: H40.9

## 2021-05-16 SURGERY — COLONOSCOPY WITH PROPOFOL
Anesthesia: General

## 2021-05-16 MED ORDER — MIDAZOLAM HCL 2 MG/2ML IJ SOLN
INTRAMUSCULAR | Status: DC | PRN
  Administered 2021-05-16: 2 mg via INTRAVENOUS

## 2021-05-16 MED ORDER — FENTANYL CITRATE (PF) 100 MCG/2ML IJ SOLN
INTRAMUSCULAR | Status: AC
  Filled 2021-05-16: qty 2

## 2021-05-16 MED ORDER — PROPOFOL 500 MG/50ML IV EMUL
INTRAVENOUS | Status: DC | PRN
  Administered 2021-05-16: 50 ug/kg/min via INTRAVENOUS

## 2021-05-16 MED ORDER — PROPOFOL 10 MG/ML IV BOLUS
INTRAVENOUS | Status: AC
  Filled 2021-05-16: qty 20

## 2021-05-16 MED ORDER — PROPOFOL 500 MG/50ML IV EMUL
INTRAVENOUS | Status: AC
  Filled 2021-05-16: qty 50

## 2021-05-16 MED ORDER — PROPOFOL 10 MG/ML IV BOLUS
INTRAVENOUS | Status: DC | PRN
  Administered 2021-05-16: 10 mg via INTRAVENOUS
  Administered 2021-05-16: 20 mg via INTRAVENOUS

## 2021-05-16 MED ORDER — LIDOCAINE HCL (CARDIAC) PF 100 MG/5ML IV SOSY
PREFILLED_SYRINGE | INTRAVENOUS | Status: DC | PRN
  Administered 2021-05-16: 50 mg via INTRAVENOUS

## 2021-05-16 MED ORDER — FENTANYL CITRATE (PF) 100 MCG/2ML IJ SOLN
INTRAMUSCULAR | Status: DC | PRN
  Administered 2021-05-16 (×4): 25 ug via INTRAVENOUS

## 2021-05-16 MED ORDER — LIDOCAINE HCL (PF) 2 % IJ SOLN
INTRAMUSCULAR | Status: AC
  Filled 2021-05-16: qty 5

## 2021-05-16 MED ORDER — SODIUM CHLORIDE 0.9 % IV SOLN
INTRAVENOUS | Status: DC
  Administered 2021-05-16: 20 mL/h via INTRAVENOUS

## 2021-05-16 MED ORDER — MIDAZOLAM HCL 2 MG/2ML IJ SOLN
INTRAMUSCULAR | Status: AC
  Filled 2021-05-16: qty 2

## 2021-05-16 NOTE — Anesthesia Preprocedure Evaluation (Signed)
Anesthesia Evaluation  ?Patient identified by MRN, date of birth, ID band ?Patient awake ? ? ? ?Reviewed: ?Allergy & Precautions, H&P , NPO status , Patient's Chart, lab work & pertinent test results, reviewed documented beta blocker date and time  ? ?History of Anesthesia Complications ?(+) PONV and history of anesthetic complications ? ?Airway ?Mallampati: II ? ? ?Neck ROM: full ? ? ? Dental ? ?(+) Poor Dentition ?  ?Pulmonary ?neg pulmonary ROS,  ?  ?Pulmonary exam normal ? ? ? ? ? ? ? Cardiovascular ?Exercise Tolerance: Good ?hypertension, On Medications ?negative cardio ROS ?Normal cardiovascular exam ?Rhythm:regular Rate:Normal ? ? ?  ?Neuro/Psych ? Neuromuscular disease negative psych ROS  ? GI/Hepatic ?Neg liver ROS, hiatal hernia, GERD  ,  ?Endo/Other  ?negative endocrine ROS ? Renal/GU ?negative Renal ROS  ?negative genitourinary ?  ?Musculoskeletal ? ? Abdominal ?  ?Peds ? Hematology ? ?(+) Blood dyscrasia, anemia ,   ?Anesthesia Other Findings ?Past Medical History: ?No date: Complication of anesthesia ?No date: CRVO (central retinal vein occlusion) ?No date: GERD (gastroesophageal reflux disease) ?No date: Glaucoma ?No date: History of hiatal hernia ?No date: Hypertension ?No date: Macular degeneration ?No date: PONV (postoperative nausea and vomiting) ?    Comment:  dry heaves in 2011 only ?Past Surgical History: ?1990's: BREAST BIOPSY; Left ?    Comment:  benign ?No date: CATARACT EXTRACTION; Bilateral ?09/2017: CATARACT EXTRACTION, BILATERAL ?2010: HERNIA REPAIR ?    Comment:  hiatal, incisional ?No date: IRIDOTOMY / IRIDECTOMY ?12/18/2017: ROBOTIC ASSISTED LAPAROSCOPIC REPAIR OF PARAESOPHAGEAL  ?HERNIA; N/A ?    Comment:  Procedure: ATTEMPTED ROBOTIC ASSISTED LAPAROSCOPIC  ?             REPAIR OF PARAESOPHAGEAL HERNIA;  Surgeon: Sterling Big  ?             F, MD;  Location: [facility] ORS;  Service: General;   ?             Laterality: N/A; ?No date: TONSILLECTOMY ?     Comment:  age 66 ?12/18/2017: VENTRAL HERNIA REPAIR ?    Comment:  Procedure: HERNIA REPAIR VENTRAL ADULT;  Surgeon: Everlene Farrier, ?             Merri Ray, MD;  Location: [facility] ORS;  Service: General;; ?BMI   ? Body Mass Index: 30.54 kg/m?  ?  ? Reproductive/Obstetrics ?negative OB ROS ? ?  ? ? ? ? ? ? ? ? ? ? ? ? ? ?  ?  ? ? ? ? ? ? ? ? ?Anesthesia Physical ?Anesthesia Plan ? ?ASA: 2 ? ?Anesthesia Plan: General  ? ?Post-op Pain Management:   ? ?Induction:  ? ?PONV Risk Score and Plan:  ? ?Airway Management Planned:  ? ?Additional Equipment:  ? ?Intra-op Plan:  ? ?Post-operative Plan:  ? ?Informed Consent: I have reviewed the patients History and Physical, chart, labs and discussed the procedure including the risks, benefits and alternatives for the proposed anesthesia with the patient or authorized representative who has indicated his/her understanding and acceptance.  ? ? ? ?Dental Advisory Given ? ?Plan Discussed with: CRNA ? ?Anesthesia Plan Comments:   ? ? ? ? ? ? ?Anesthesia Quick Evaluation ? ?

## 2021-05-16 NOTE — Transfer of Care (Signed)
Immediate Anesthesia Transfer of Care Note ? ?Patient: Briana Hines ? ?Procedure(s) Performed: COLONOSCOPY WITH PROPOFOL ? ?Patient Location: PACU ? ?Anesthesia Type:General ? ?Level of Consciousness: sedated ? ?Airway & Oxygen Therapy: Patient Spontanous Breathing ? ?Post-op Assessment: Report given to RN and Post -op Vital signs reviewed and stable ? ?Post vital signs: Reviewed and stable ? ?Last Vitals:  ?Vitals Value Taken Time  ?BP 106/68 05/16/21 0808  ?Temp    ?Pulse 64 05/16/21 0809  ?Resp 21 05/16/21 0809  ?SpO2 99 % 05/16/21 0809  ?Vitals shown include unvalidated device data. ? ?Last Pain:  ?Vitals:  ? 05/16/21 0800  ?TempSrc: Temporal  ?PainSc:   ?   ? ?  ? ?Complications: No notable events documented. ?

## 2021-05-16 NOTE — Interval H&P Note (Signed)
History and Physical Interval Note: ? ?05/16/2021 ?7:42 AM ? ?Briana Hines  has presented today for surgery, with the diagnosis of Z86.010 - History of colon polyps.  The various methods of treatment have been discussed with the patient and family. After consideration of risks, benefits and other options for treatment, the patient has consented to  Procedure(s): ?COLONOSCOPY WITH PROPOFOL (N/A) as a surgical intervention.  The patient's history has been reviewed, patient examined, no change in status, stable for surgery.  I have reviewed the patient's chart and labs.  Questions were answered to the patient's satisfaction.   ? ? ?Rossie Muskrat Gregrey Bloyd ? ?Ok to proceed with colonoscopy ?

## 2021-05-16 NOTE — H&P (Signed)
Outpatient short stay form Pre-procedure ?05/16/2021  ?Regis Bill, MD ? ?Primary Physician: Jerl Mina, MD ? ?Reason for visit:  Personal History of adenomatous polyps ? ?History of present illness:   ? ?66 y/o lady with history of hypertension here for surveillance colonoscopy for history of adenomatous polyps. History of many hiatal hernia repairs. No family history of GI malignancies. No blood thinners. ? ? ? ?Current Facility-Administered Medications:  ?  0.9 %  sodium chloride infusion, , Intravenous, Continuous, Emylie Amster, Rossie Muskrat, MD, Last Rate: 20 mL/hr at 05/16/21 0703, 20 mL/hr at 05/16/21 0703 ? ?Medications Prior to Admission  ?Medication Sig Dispense Refill Last Dose  ? amLODipine (NORVASC) 5 MG tablet Take 5 mg by mouth daily.   05/16/2021 at 0515  ? aspirin EC 81 MG tablet Take 81 mg by mouth daily.   Past Week  ? Cyanocobalamin (B-12) 1000 MCG CAPS Take 1,000 mcg by mouth daily.    05/15/2021  ? metoprolol succinate (TOPROL-XL) 25 MG 24 hr tablet Take 25 mg by mouth at bedtime.    05/16/2021 at 0515  ? Multiple Vitamin (MULTI-VITAMINS) TABS Take 1 tablet by mouth 3 (three) times a week.    05/15/2021  ? Multiple Vitamins-Minerals (ICAPS AREDS 2 PO) Take 1 tablet by mouth 2 (two) times daily.    05/15/2021  ? Omega-3 Fatty Acids (FISH OIL) 1200 MG CAPS Take 1,200 mg by mouth daily.    05/15/2021  ? omeprazole (PRILOSEC) 40 MG capsule Take 1 capsule (40 mg total) by mouth daily. 30 capsule 1 05/15/2021  ? ? ? ?No Known Allergies ? ? ?Past Medical History:  ?Diagnosis Date  ? Complication of anesthesia   ? CRVO (central retinal vein occlusion)   ? GERD (gastroesophageal reflux disease)   ? Glaucoma   ? History of hiatal hernia   ? Hypertension   ? Macular degeneration   ? PONV (postoperative nausea and vomiting)   ? dry heaves in 2011 only  ? ? ?Review of systems:  Otherwise negative.  ? ? ?Physical Exam ? ?Gen: Alert, oriented. Appears stated age.  ?HEENT: PERRLA. ?Lungs: No respiratory distress ?CV:  RRR ?Abd: soft, benign, no masses ?Ext: No edema ? ? ? ?Planned procedures: Proceed with colonoscopy. The patient understands the nature of the planned procedure, indications, risks, alternatives and potential complications including but not limited to bleeding, infection, perforation, damage to internal organs and possible oversedation/side effects from anesthesia. The patient agrees and gives consent to proceed.  ?Please refer to procedure notes for findings, recommendations and patient disposition/instructions.  ? ? ? ?Regis Bill, MD ?[facility] ? ? ? ?  ? ?

## 2021-05-16 NOTE — Op Note (Signed)
[facility] ?Gastroenterology ?Patient Name: Briana Hines ?Procedure Date: 05/16/2021 7:24 AM ?MRN: [id number] ?Account #: [id number] ?Date of Birth: [birth year] ?Admit Type: Outpatient ?Age: 66 ?Room: [facility] ENDO ROOM 3 ?Gender: Female ?Note Status: Finalized ?Instrument Name: Colonoscope [id number] ?Procedure:             Colonoscopy ?Indications:           High risk colon cancer surveillance: Personal history  ?                       of colonic polyps, Last colonoscopy 5 years ago ?Providers:             Andrey Farmer MD, MD ?Medicines:             Monitored Anesthesia Care ?Complications:         No immediate complications. ?Procedure:             Pre-Anesthesia Assessment: ?                       - Prior to the procedure, a History and Physical was  ?                       performed, and patient medications and allergies were  ?                       reviewed. The patient is competent. The risks and  ?                       benefits of the procedure and the sedation options and  ?                       risks were discussed with the patient. All questions  ?                       were answered and informed consent was obtained.  ?                       Patient identification and proposed procedure were  ?                       verified by the physician, the nurse, the  ?                       anesthesiologist, the anesthetist and the technician  ?                       in the endoscopy suite. Mental Status Examination:  ?                       alert and oriented. Airway Examination: normal  ?                       oropharyngeal airway and neck mobility. Respiratory  ?                       Examination: clear to auscultation. CV Examination:  ?                       normal. Prophylactic Antibiotics: The patient does  not  ?                       require prophylactic antibiotics. Prior  ?                       Anticoagulants: The patient has taken no previous  ?                       anticoagulant or  antiplatelet agents except for  ?                       aspirin. ASA Grade Assessment: II - A patient with  ?                       mild systemic disease. After reviewing the risks and  ?                       benefits, the patient was deemed in satisfactory  ?                       condition to undergo the procedure. The anesthesia  ?                       plan was to use monitored anesthesia care (MAC).  ?                       Immediately prior to administration of medications,  ?                       the patient was re-assessed for adequacy to receive  ?                       sedatives. The heart rate, respiratory rate, oxygen  ?                       saturations, blood pressure, adequacy of pulmonary  ?                       ventilation, and response to care were monitored  ?                       throughout the procedure. The physical status of the  ?                       patient was re-assessed after the procedure. ?                       After obtaining informed consent, the colonoscope was  ?                       passed under direct vision. Throughout the procedure,  ?                       the patient's blood pressure, pulse, and oxygen  ?                       saturations were monitored continuously. The  ?  Colonoscope was introduced through the anus and  ?                       advanced to the the cecum, identified by appendiceal  ?                       orifice and ileocecal valve. The colonoscopy was  ?                       performed without difficulty. The patient tolerated  ?                       the procedure well. The quality of the bowel  ?                       preparation was good. ?Findings: ?     The perianal and digital rectal examinations were normal. ?     A few small-mouthed diverticula were found in the sigmoid colon. ?     The exam was otherwise without abnormality on direct and retroflexion  ?     views. ?Impression:            - Diverticulosis in the sigmoid  colon. ?                       - The examination was otherwise normal on direct and  ?                       retroflexion views. ?                       - No specimens collected. ?Recommendation:        - Discharge patient to home. ?                       - Resume previous diet. ?                       - Continue present medications. ?                       - Repeat colonoscopy in 5 years for surveillance. ?                       - Return to referring physician as previously  ?                       scheduled. ?Procedure Code(s):     --- Professional --- ?                       G0105, Colorectal cancer screening; colonoscopy on  ?                       individual at high risk ?Diagnosis Code(s):     --- Professional --- ?                       Z86.010, Personal history of colonic polyps ?                       K57.30, Diverticulosis of large intestine without  ?  perforation or abscess without bleeding ?CPT copyright 2019 [employer]. All rights reserved. ?The codes documented in this report are preliminary and upon coder review may  ?be revised to meet current compliance requirements. ?Andrey Farmer MD, MD ?05/16/2021 8:09:12 AM ?Number of Addenda: 0 ?Note Initiated On: 05/16/2021 7:24 AM ?Scope Withdrawal Time: 0 hours 8 minutes 42 seconds  ?Total Procedure Duration: 0 hours 16 minutes 2 seconds  ?Estimated Blood Loss:  Estimated blood loss: none. ?     [facility] ?

## 2021-05-17 NOTE — Anesthesia Postprocedure Evaluation (Signed)
Anesthesia Post Note ? ?Patient: Briana Hines ? ?Procedure(s) Performed: COLONOSCOPY WITH PROPOFOL ? ?Patient location during evaluation: PACU ?Anesthesia Type: General ?Level of consciousness: awake and alert ?Pain management: pain level controlled ?Vital Signs Assessment: post-procedure vital signs reviewed and stable ?Respiratory status: spontaneous breathing, nonlabored ventilation, respiratory function stable and patient connected to nasal cannula oxygen ?Cardiovascular status: blood pressure returned to baseline and stable ?Postop Assessment: no apparent nausea or vomiting ?Anesthetic complications: no ? ? ?No notable events documented. ? ? ?Last Vitals:  ?Vitals:  ? 05/16/21 0820 05/16/21 0830  ?BP: 121/76 117/72  ?Pulse: 68 66  ?Resp: 16 (!) 23  ?Temp:    ?SpO2: 98% 99%  ?  ?Last Pain:  ?Vitals:  ? 05/16/21 0800  ?TempSrc: Temporal  ?PainSc:   ? ? ?  ?  ?  ?  ?  ?  ? ?Yevette Edwards ? ? ? ? ?

## 2021-05-30 ENCOUNTER — Other Ambulatory Visit: Payer: Self-pay | Admitting: Family Medicine

## 2021-05-30 DIAGNOSIS — Z1231 Encounter for screening mammogram for malignant neoplasm of breast: Secondary | ICD-10-CM

## 2021-06-14 ENCOUNTER — Ambulatory Visit (INDEPENDENT_AMBULATORY_CARE_PROVIDER_SITE_OTHER): Payer: 59 | Admitting: Ophthalmology

## 2021-06-14 ENCOUNTER — Encounter (INDEPENDENT_AMBULATORY_CARE_PROVIDER_SITE_OTHER): Payer: Self-pay | Admitting: Ophthalmology

## 2021-06-14 DIAGNOSIS — H353211 Exudative age-related macular degeneration, right eye, with active choroidal neovascularization: Secondary | ICD-10-CM

## 2021-06-14 MED ORDER — AFLIBERCEPT 2MG/0.05ML IZ SOLN FOR KALEIDOSCOPE
2.0000 mg | INTRAVITREAL | Status: AC | PRN
  Administered 2021-06-14: 2 mg via INTRAVITREAL

## 2021-06-14 NOTE — Progress Notes (Signed)
? ? ?06/14/2021 ? ?  ? ?CHIEF COMPLAINT ?Patient presents for  ?Chief Complaint  ?Patient presents with  ? Macular Degeneration  ? ? ? ? ?HISTORY OF PRESENT ILLNESS: ?Briana Hines is a 66 y.o. female who presents to the clinic today for:  ? ?HPI   ?6 weeks dilate OD, Eylea OD, OCT. ?Patient states vision is stable and unchanged since last visit. Denies any new floaters or FOL. ?Patient states she has a stable floater in the right eye that comes and goes. ?Last edited by Nelva Nay on 06/14/2021  3:44 PM.  ?  ? ? ?Referring physician: ?Jerl Mina, MD ?[address] [place] ?[facility] ?[place],  [place] [address] ? ?HISTORICAL INFORMATION:  ? ?Selected notes from the MEDICAL RECORD NUMBER ?  ?   ? ?CURRENT MEDICATIONS: ?No current outpatient medications on file. (Ophthalmic Drugs)  ? ?No current facility-administered medications for this visit. (Ophthalmic Drugs)  ? ?Current Outpatient Medications (Other)  ?Medication Sig  ? amLODipine (NORVASC) 5 MG tablet Take 5 mg by mouth daily.  ? aspirin EC 81 MG tablet Take 81 mg by mouth daily.  ? Cyanocobalamin (B-12) 1000 MCG CAPS Take 1,000 mcg by mouth daily.   ? metoprolol succinate (TOPROL-XL) 25 MG 24 hr tablet Take 25 mg by mouth at bedtime.   ? Multiple Vitamin (MULTI-VITAMINS) TABS Take 1 tablet by mouth 3 (three) times a week.   ? Multiple Vitamins-Minerals (ICAPS AREDS 2 PO) Take 1 tablet by mouth 2 (two) times daily.   ? Omega-3 Fatty Acids (FISH OIL) 1200 MG CAPS Take 1,200 mg by mouth daily.   ? omeprazole (PRILOSEC) 40 MG capsule Take 1 capsule (40 mg total) by mouth daily.  ? ?No current facility-administered medications for this visit. (Other)  ? ? ? ? ?REVIEW OF SYSTEMS: ?ROS   ?Negative for: Constitutional, Gastrointestinal, Neurological, Skin, Genitourinary, Musculoskeletal, HENT, Endocrine, Cardiovascular, Eyes, Respiratory, Psychiatric, Allergic/Imm, Heme/Lymph ?Last edited by Edmon Crape, MD on 06/14/2021  4:50 PM.  ?  ? ? ? ?ALLERGIES ?No Known  Allergies ? ?PAST MEDICAL HISTORY ?Past Medical History:  ?Diagnosis Date  ? Complication of anesthesia   ? CRVO (central retinal vein occlusion)   ? GERD (gastroesophageal reflux disease)   ? Glaucoma   ? History of hiatal hernia   ? Hypertension   ? Macular degeneration   ? PONV (postoperative nausea and vomiting)   ? dry heaves in 2011 only  ? ?Past Surgical History:  ?Procedure Laterality Date  ? BREAST BIOPSY Left 1990's  ? benign  ? CATARACT EXTRACTION Bilateral   ? CATARACT EXTRACTION, BILATERAL  09/2017  ? COLONOSCOPY WITH PROPOFOL N/A 05/16/2021  ? Procedure: COLONOSCOPY WITH PROPOFOL;  Surgeon: Regis Bill, MD;  Location: [facility] ENDOSCOPY;  Service: Endoscopy;  Laterality: N/A;  ? HERNIA REPAIR  2010  ? hiatal, incisional  ? IRIDOTOMY / IRIDECTOMY    ? ROBOTIC ASSISTED LAPAROSCOPIC REPAIR OF PARAESOPHAGEAL HERNIA N/A 12/18/2017  ? Procedure: ATTEMPTED ROBOTIC ASSISTED LAPAROSCOPIC REPAIR OF PARAESOPHAGEAL HERNIA;  Surgeon: Leafy Ro, MD;  Location: [facility] ORS;  Service: General;  Laterality: N/A;  ? TONSILLECTOMY    ? age 22  ? VENTRAL HERNIA REPAIR  12/18/2017  ? Procedure: HERNIA REPAIR VENTRAL ADULT;  Surgeon: Leafy Ro, MD;  Location: [facility] ORS;  Service: General;;  ? ? ?FAMILY HISTORY ?Family History  ?Problem Relation Age of Onset  ? COPD Mother   ? Cancer Mother   ?  not sure what type  ? Hypertension Mother   ? COPD Father   ? Heart attack Brother   ? High blood pressure Brother   ? Breast cancer Neg Hx   ? ? ?SOCIAL HISTORY ?Social History  ? ?Tobacco Use  ? Smoking status: Never  ? Smokeless tobacco: Never  ?Vaping Use  ? Vaping Use: Never used  ?Substance Use Topics  ? Alcohol use: Never  ? Drug use: Never  ? ?  ? ?  ? ?OPHTHALMIC EXAM: ? ?Base Eye Exam   ? ? Visual Acuity (ETDRS)   ? ?   Right Left  ? Dist [place] 20/40 -2 20/25 -2  ? Dist ph [place] NI   ? ? Correction: Glasses  ? ?  ?  ? ? Tonometry (Tonopen, 3:47 PM)   ? ?   Right Left  ? Pressure 13 10  ? ?  ?  ? ? Pupils   ? ?   APD  ?  Right None  ? Left None  ? ?  ?  ? ? Extraocular Movement   ? ?   Right Left  ?  Full Full  ? ?  ?  ? ? Neuro/Psych   ? ? Oriented x3: Yes  ? Mood/Affect: Normal  ? ?  ?  ? ? Dilation   ? ? Right eye: 1.0% Mydriacyl, 2.5% Phenylephrine @ 3:47 PM  ? ?  ?  ? ?  ? ?Slit Lamp and Fundus Exam   ? ? External Exam   ? ?   Right Left  ? External Normal Normal  ? ?  ?  ? ? Slit Lamp Exam   ? ?   Right Left  ? Lids/Lashes Normal Normal  ? Conjunctiva/Sclera White and quiet White and quiet  ? Cornea Clear Clear  ? Anterior Chamber Deep and quiet Deep and quiet  ? Iris Round and reactive Round and reactive  ? Lens Centered posterior chamber intraocular lens Centered posterior chamber intraocular lens  ? Anterior Vitreous Normal Normal  ? ?  ?  ? ? Fundus Exam   ? ?   Right Left  ? Posterior Vitreous Posterior vitreous detachment, Centered posterior chamber intraocular lens   ? Disc Normal   ? C/D Ratio 0.6   ? Macula Intermediate soft drusen, Macular thickening temporally   ? Vessels Normal   ? Periphery Normal   ? ?  ?  ? ?  ? ?Refraction   ? ? Wearing Rx   ? ? Age: 53 yr  ? ?  ?  ? ?  ? ? ?IMAGING AND PROCEDURES  ?Imaging and Procedures for 06/14/21 ? ?Intravitreal Injection, Pharmacologic Agent - OD - Right Eye   ? ?   ?Time Out ?06/14/2021. 4:51 PM. Confirmed correct patient, procedure, site, and patient consented.  ? ?Anesthesia ?Subconjunctival anesthesia was used. Anesthetic medications included Lidocaine 4%.  ? ?Procedure ?Preparation included 5% betadine to ocular surface, 10% betadine to eyelids, Tobramycin 0.3%. A 30 gauge needle was used.  ? ?Injection: ?2 mg aflibercept 2 MG/0.05ML ?  Route: Intravitreal, Site: Right Eye ?  NDC: [id number], Lot: [id number], Waste: 0 mL  ? ?Post-op ?Post injection exam found visual acuity of at least counting fingers. The patient tolerated the procedure well. There were no complications. The patient received written and verbal post procedure care education. Post injection medications  included ocuflox.  ? ?  ? ?OCT, Retina - OU - Both Eyes   ? ?   ?  Right Eye ?Quality was good. Scan locations included subfoveal. Central Foveal Thickness: 237. Progression has been stable. Findings include abnormal foveal contour, cystoid macular edema, choroidal neovascular membrane, subretinal hyper-reflective material, subretinal fluid.  ? ?Left Eye ?Scan locations included subfoveal. Central Foveal Thickness: 261. Progression has been stable. Findings include abnormal foveal contour, retinal drusen , no IRF.  ? 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 ?OS persistent unchanged subfoveal drusen, no sign of CNVM ? ?  ? ? ?  ?  ? ?  ?ASSESSMENT/PLAN: ? ?Exudative age-related macular degeneration of right eye with active choroidal neovascularization (HCC) ?OD, improved overall since onset of therapy some 7 months previous.  PED still present but overlying intraretinal fluid has diminished.  Size a PED slightly less.  Repeat Eylea today and maintain 6-week interval to prevent progression  ? ?  ICD-10-CM   ?1. Exudative age-related macular degeneration of right eye with active choroidal neovascularization (HCC)  H35.3211 Intravitreal Injection, Pharmacologic Agent - OD - Right Eye  ?  OCT, Retina - OU - Both Eyes  ?  aflibercept (EYLEA) SOLN 2 mg  ?  ? ? ?1.  OD, vastly improved overall yet still active from wet AMD will repeat injection today and reevaluate again in 6 weeks. ? ?2.  Patient to return promptly if new visual symptoms develop in either eye ? ?3. ? ?Ophthalmic Meds Ordered this visit:  ?Meds ordered this encounter  ?Medications  ? aflibercept (EYLEA) SOLN 2 mg  ? ? ?  ? ?Return in about 6 weeks (around 07/26/2021) for DILATE OU, EYLEA OCT,  OD. ? ?There are no Patient Instructions on file for this visit. ? ? ?Explained the diagnoses, plan, and follow up with the patient and they expressed understanding.  Patient expressed understanding of the impor

## 2021-06-14 NOTE — Assessment & Plan Note (Signed)
OD, improved overall since onset of therapy some 7 months previous.  PED still present but overlying intraretinal fluid has diminished.  Size a PED slightly less.  Repeat Eylea today and maintain 6-week interval to prevent progression ?

## 2021-07-07 ENCOUNTER — Ambulatory Visit
Admission: RE | Admit: 2021-07-07 | Discharge: 2021-07-07 | Disposition: A | Discharge status: Home / Self Care | Payer: 59 | Source: Ambulatory Visit | Attending: Family Medicine | Admitting: Family Medicine

## 2021-07-07 DIAGNOSIS — Z1231 Encounter for screening mammogram for malignant neoplasm of breast: Secondary | ICD-10-CM | POA: Diagnosis present

## 2021-07-26 ENCOUNTER — Ambulatory Visit (INDEPENDENT_AMBULATORY_CARE_PROVIDER_SITE_OTHER): Payer: 59 | Admitting: Ophthalmology

## 2021-07-26 DIAGNOSIS — H353132 Nonexudative age-related macular degeneration, bilateral, intermediate dry stage: Secondary | ICD-10-CM | POA: Diagnosis not present

## 2021-07-26 DIAGNOSIS — H353211 Exudative age-related macular degeneration, right eye, with active choroidal neovascularization: Secondary | ICD-10-CM | POA: Diagnosis not present

## 2021-07-26 MED ORDER — AFLIBERCEPT 2MG/0.05ML IZ SOLN FOR KALEIDOSCOPE
2.0000 mg | INTRAVITREAL | Status: AC | PRN
  Administered 2021-07-26: 2 mg via INTRAVITREAL

## 2021-07-26 NOTE — Progress Notes (Signed)
07/26/2021     CHIEF COMPLAINT Patient presents for  Chief Complaint  Patient presents with   Macular Degeneration      HISTORY OF PRESENT ILLNESS: Briana Hines is a 66 y.o. female who presents to the clinic today for:   HPI   6 weeks for DILATE OU, EYLEA OCT, OD. Pt stated vision has not changed since last visit. Pt denies FOL and new floaters.  Last edited by [name] on 07/26/2021  3:34 PM.      Referring physician: Jerl Mina, MD [address] [facility] [place],  [place] [address]  HISTORICAL INFORMATION:   Selected notes from the MEDICAL RECORD NUMBER       CURRENT MEDICATIONS: No current outpatient medications on file. (Ophthalmic Drugs)   No current facility-administered medications for this visit. (Ophthalmic Drugs)   Current Outpatient Medications (Other)  Medication Sig   amLODipine (NORVASC) 5 MG tablet Take 5 mg by mouth daily.   aspirin EC 81 MG tablet Take 81 mg by mouth daily.   Cyanocobalamin (B-12) 1000 MCG CAPS Take 1,000 mcg by mouth daily.    metoprolol succinate (TOPROL-XL) 25 MG 24 hr tablet Take 25 mg by mouth at bedtime.    Multiple Vitamin (MULTI-VITAMINS) TABS Take 1 tablet by mouth 3 (three) times a week.    Multiple Vitamins-Minerals (ICAPS AREDS 2 PO) Take 1 tablet by mouth 2 (two) times daily.    Omega-3 Fatty Acids (FISH OIL) 1200 MG CAPS Take 1,200 mg by mouth daily.    omeprazole (PRILOSEC) 40 MG capsule Take 1 capsule (40 mg total) by mouth daily.   No current facility-administered medications for this visit. (Other)      REVIEW OF SYSTEMS: ROS   Negative for: Constitutional, Gastrointestinal, Neurological, Skin, Genitourinary, Musculoskeletal, HENT, Endocrine, Cardiovascular, Eyes, Respiratory, Psychiatric, Allergic/Imm, Heme/Lymph Last edited by [name] on 07/26/2021  3:34 PM.       ALLERGIES No Known Allergies  PAST MEDICAL HISTORY Past Medical History:  Diagnosis Date   Complication of anesthesia     CRVO (central retinal vein occlusion)    GERD (gastroesophageal reflux disease)    Glaucoma    History of hiatal hernia    Hypertension    Macular degeneration    PONV (postoperative nausea and vomiting)    dry heaves in 2011 only   Past Surgical History:  Procedure Laterality Date   BREAST BIOPSY Left 1990's   benign   CATARACT EXTRACTION Bilateral    CATARACT EXTRACTION, BILATERAL  09/2017   COLONOSCOPY WITH PROPOFOL N/A 05/16/2021   Procedure: COLONOSCOPY WITH PROPOFOL;  Surgeon: Regis Bill, MD;  Location: [facility] ENDOSCOPY;  Service: Endoscopy;  Laterality: N/A;   HERNIA REPAIR  2010   hiatal, incisional   IRIDOTOMY / IRIDECTOMY     ROBOTIC ASSISTED LAPAROSCOPIC REPAIR OF PARAESOPHAGEAL HERNIA N/A 12/18/2017   Procedure: ATTEMPTED ROBOTIC ASSISTED LAPAROSCOPIC REPAIR OF PARAESOPHAGEAL HERNIA;  Surgeon: Leafy Ro, MD;  Location: [facility] ORS;  Service: General;  Laterality: N/A;   TONSILLECTOMY     age 83   VENTRAL HERNIA REPAIR  12/18/2017   Procedure: HERNIA REPAIR VENTRAL ADULT;  Surgeon: Leafy Ro, MD;  Location: [facility] ORS;  Service: General;;    FAMILY HISTORY Family History  Problem Relation Age of Onset   COPD Mother    Cancer Mother        not sure what type   Hypertension Mother    COPD Father  Heart attack Brother    High blood pressure Brother    Breast cancer Neg Hx     SOCIAL HISTORY Social History   Tobacco Use   Smoking status: Never   Smokeless tobacco: Never  Vaping Use   Vaping Use: Never used  Substance Use Topics   Alcohol use: Never   Drug use: Never         OPHTHALMIC EXAM:  Base Eye Exam     Visual Acuity (ETDRS)       Right Left   Dist [place] 20/40 -2 20/40 -1   Dist ph [place] 20/30 -1 20/30 -1 +2         Tonometry (Tonopen, 3:42 PM)       Right Left   Pressure 14 17         Pupils       Pupils APD   Right PERRL None   Left PERRL None         Visual Fields       Left Right    Full Full          Extraocular Movement       Right Left    Full Full         Neuro/Psych     Oriented x3: Yes   Mood/Affect: Normal         Dilation     Both eyes: 1.0% Mydriacyl, 2.5% Phenylephrine @ 3:42 PM           Slit Lamp and Fundus Exam     External Exam       Right Left   External Normal Normal         Slit Lamp Exam       Right Left   Lids/Lashes Normal Normal   Conjunctiva/Sclera White and quiet White and quiet   Cornea Clear Clear   Anterior Chamber Deep and quiet Deep and quiet   Iris Round and reactive Round and reactive   Lens Centered posterior chamber intraocular lens Centered posterior chamber intraocular lens   Anterior Vitreous Normal Normal         Fundus Exam       Right Left   Posterior Vitreous Posterior vitreous detachment, Centered posterior chamber intraocular lens    Disc Normal    C/D Ratio 0.6    Macula Intermediate soft drusen, Macular thickening temporally    Vessels Normal    Periphery Normal             IMAGING AND PROCEDURES  Imaging and Procedures for 07/26/21  OCT, Retina - OU - Both Eyes       Right Eye Quality was good. Scan locations included subfoveal. Central Foveal Thickness: 232. Progression has been stable. Findings include abnormal foveal contour, subretinal hyper-reflective material, choroidal neovascular membrane, cystoid macular edema, subretinal fluid.   Left Eye Scan locations included subfoveal. Central Foveal Thickness: 260. Progression has been stable. Findings include no IRF, abnormal foveal contour, retinal drusen .   Notes OD with recent onset subretinal fluid signs of CNVM With RPE detachment temporally, as of October 2022, now improved postinjection of Avastin with smaller pigment epithelial detachment and serous retinal detachment, yet still active at 6-week interval repeat today and maintain 6-week interval  OS persistent unchanged subfoveal drusen, no sign of CNVM      Intravitreal  Injection, Pharmacologic Agent - OD - Right Eye       Time Out 07/26/2021. 4:11 PM. Confirmed correct patient,  procedure, site, and patient consented.   Anesthesia Subconjunctival anesthesia was used. Anesthetic medications included Lidocaine 4%.   Procedure Preparation included 5% betadine to ocular surface, 10% betadine to eyelids, Tobramycin 0.3%. A 30 gauge needle was used.   Injection: 2 mg aflibercept 2 MG/0.05ML   Route: Intravitreal, Site: Right Eye   NDC: L6038910, Lot: [id number], Waste: 0 mL   Post-op Post injection exam found visual acuity of at least counting fingers. The patient tolerated the procedure well. There were no complications. The patient received written and verbal post procedure care education. Post injection medications included ocuflox.              ASSESSMENT/PLAN:  Exudative age-related macular degeneration of right eye with active choroidal neovascularization (HCC) OD, improved macular findings stable acuity, on Eylea currently at interval of 6 weeks.  Repeat injection today to maintain.  Intermediate stage nonexudative age-related macular degeneration of both eyes No sign of CNVM OS today     ICD-10-CM   1. Exudative age-related macular degeneration of right eye with active choroidal neovascularization (HCC)  H35.3211 OCT, Retina - OU - Both Eyes    Intravitreal Injection, Pharmacologic Agent - OD - Right Eye    aflibercept (EYLEA) SOLN 2 mg    2. Intermediate stage nonexudative age-related macular degeneration of both eyes  H35.3132       1.  OD overall vastly improved much less active wet AMD today.  2.  At 6-week interval today.  Repeat injection today to maintain follow-up next in 7 weeks  3.  Ophthalmic Meds Ordered this visit:  Meds ordered this encounter  Medications   aflibercept (EYLEA) SOLN 2 mg       Return in about 7 weeks (around 09/13/2021) for dilate, OD, EYLEA OCT.  There are no Patient Instructions on file  for this visit.   Explained the diagnoses, plan, and follow up with the patient and they expressed understanding.  Patient expressed understanding of the importance of proper follow up care.   Alford Highland Julia Alkhatib M.D. Diseases & Surgery of the Retina and Vitreous [facility] 07/26/21     Abbreviations: M myopia (nearsighted); A astigmatism; H hyperopia (farsighted); P presbyopia; Mrx spectacle prescription;  CTL contact lenses; OD right eye; OS left eye; OU both eyes  XT exotropia; ET esotropia; PEK punctate epithelial keratitis; PEE punctate epithelial erosions; DES dry eye syndrome; MGD meibomian gland dysfunction; ATs artificial tears; PFAT's preservative free artificial tears; NSC nuclear sclerotic cataract; PSC posterior subcapsular cataract; ERM epi-retinal membrane; PVD posterior vitreous detachment; RD retinal detachment; DM diabetes mellitus; DR diabetic retinopathy; NPDR non-proliferative diabetic retinopathy; PDR proliferative diabetic retinopathy; CSME clinically significant macular edema; DME diabetic macular edema; dbh dot blot hemorrhages; CWS cotton wool spot; POAG primary open angle glaucoma; C/D cup-to-disc ratio; HVF humphrey visual field; GVF goldmann visual field; OCT optical coherence tomography; IOP intraocular pressure; BRVO Branch retinal vein occlusion; CRVO central retinal vein occlusion; CRAO central retinal artery occlusion; BRAO branch retinal artery occlusion; RT retinal tear; SB scleral buckle; PPV pars plana vitrectomy; VH Vitreous hemorrhage; PRP panretinal laser photocoagulation; IVK intravitreal kenalog; VMT vitreomacular traction; MH Macular hole;  NVD neovascularization of the disc; NVE neovascularization elsewhere; AREDS age related eye disease study; ARMD age related macular degeneration; POAG primary open angle glaucoma; EBMD epithelial/anterior basement membrane dystrophy; ACIOL anterior chamber intraocular lens; IOL intraocular lens; PCIOL  posterior chamber intraocular lens; Phaco/IOL phacoemulsification with intraocular lens placement; PRK photorefractive keratectomy; LASIK laser assisted in  situ keratomileusis; HTN hypertension; DM diabetes mellitus; COPD chronic obstructive pulmonary disease

## 2021-07-26 NOTE — Assessment & Plan Note (Signed)
OD, improved macular findings stable acuity, on Eylea currently at interval of 6 weeks.  Repeat injection today to maintain.

## 2021-07-26 NOTE — Assessment & Plan Note (Signed)
No sign of CNVM OS today 

## 2021-09-13 ENCOUNTER — Encounter (INDEPENDENT_AMBULATORY_CARE_PROVIDER_SITE_OTHER): Payer: 59 | Admitting: Ophthalmology

## 2021-09-15 ENCOUNTER — Ambulatory Visit (INDEPENDENT_AMBULATORY_CARE_PROVIDER_SITE_OTHER): Payer: 59 | Admitting: Ophthalmology

## 2021-09-15 ENCOUNTER — Encounter (INDEPENDENT_AMBULATORY_CARE_PROVIDER_SITE_OTHER): Payer: Self-pay | Admitting: Ophthalmology

## 2021-09-15 DIAGNOSIS — H353211 Exudative age-related macular degeneration, right eye, with active choroidal neovascularization: Secondary | ICD-10-CM

## 2021-09-15 DIAGNOSIS — H353132 Nonexudative age-related macular degeneration, bilateral, intermediate dry stage: Secondary | ICD-10-CM

## 2021-09-15 MED ORDER — AFLIBERCEPT 2MG/0.05ML IZ SOLN FOR KALEIDOSCOPE
2.0000 mg | INTRAVITREAL | Status: AC | PRN
  Administered 2021-09-15: 2 mg via INTRAVITREAL

## 2021-09-15 NOTE — Progress Notes (Signed)
09/15/2021     CHIEF COMPLAINT Patient presents for  Chief Complaint  Patient presents with   Macular Degeneration      HISTORY OF PRESENT ILLNESS: Briana Hines is a 66 y.o. female who presents to the clinic today for:   HPI   Pt states her vision has been stable Pt denies any new floaters or FOL Last edited by Aleene Davidson, CMA on 09/15/2021  3:46 PM.      Referring physician: Jerl Mina, MD [address] [facility] [place],  [place] [address]  HISTORICAL INFORMATION:   Selected notes from the MEDICAL RECORD NUMBER       CURRENT MEDICATIONS: No current outpatient medications on file. (Ophthalmic Drugs)   No current facility-administered medications for this visit. (Ophthalmic Drugs)   Current Outpatient Medications (Other)  Medication Sig   amLODipine (NORVASC) 5 MG tablet Take 5 mg by mouth daily.   aspirin EC 81 MG tablet Take 81 mg by mouth daily.   Cyanocobalamin (B-12) 1000 MCG CAPS Take 1,000 mcg by mouth daily.    metoprolol succinate (TOPROL-XL) 25 MG 24 hr tablet Take 25 mg by mouth at bedtime.    Multiple Vitamin (MULTI-VITAMINS) TABS Take 1 tablet by mouth 3 (three) times a week.    Multiple Vitamins-Minerals (ICAPS AREDS 2 PO) Take 1 tablet by mouth 2 (two) times daily.    Omega-3 Fatty Acids (FISH OIL) 1200 MG CAPS Take 1,200 mg by mouth daily.    omeprazole (PRILOSEC) 40 MG capsule Take 1 capsule (40 mg total) by mouth daily.   No current facility-administered medications for this visit. (Other)      REVIEW OF SYSTEMS: ROS   Negative for: Constitutional, Gastrointestinal, Neurological, Skin, Genitourinary, Musculoskeletal, HENT, Endocrine, Cardiovascular, Eyes, Respiratory, Psychiatric, Allergic/Imm, Heme/Lymph Last edited by Aleene Davidson, CMA on 09/15/2021  3:46 PM.       ALLERGIES No Known Allergies  PAST MEDICAL HISTORY Past Medical History:  Diagnosis Date   Complication of anesthesia    CRVO (central retinal  vein occlusion)    GERD (gastroesophageal reflux disease)    Glaucoma    History of hiatal hernia    Hypertension    Macular degeneration    PONV (postoperative nausea and vomiting)    dry heaves in 2011 only   Past Surgical History:  Procedure Laterality Date   BREAST BIOPSY Left 1990's   benign   CATARACT EXTRACTION Bilateral    CATARACT EXTRACTION, BILATERAL  09/2017   COLONOSCOPY WITH PROPOFOL N/A 05/16/2021   Procedure: COLONOSCOPY WITH PROPOFOL;  Surgeon: Regis Bill, MD;  Location: [facility];  Service: Endoscopy;  Laterality: N/A;   HERNIA REPAIR  2010   hiatal, incisional   IRIDOTOMY / IRIDECTOMY     ROBOTIC ASSISTED LAPAROSCOPIC REPAIR OF PARAESOPHAGEAL HERNIA N/A 12/18/2017   Procedure: ATTEMPTED ROBOTIC ASSISTED LAPAROSCOPIC REPAIR OF PARAESOPHAGEAL HERNIA;  Surgeon: Leafy Ro, MD;  Location: [facility] OR[facility];  Service: General;  Laterality: N/A;   TONSILLECTOMY     age 6   VENTRAL HERNIA REPAIR  12/18/2017   Procedure: HERNIA REPAIR VENTRAL ADULT;  Surgeon: Leafy Ro, MD;  Location: [facility] ORS;  Service: General;;    FAMILY HISTORY Family History  Problem Relation Age of Onset   COPD Mother    Cancer Mother        not sure what type   Hypertension Mother    COPD Father    Heart attack Brother  High blood pressure Brother    Breast cancer Neg Hx     SOCIAL HISTORY Social History   Tobacco Use   Smoking status: Never   Smokeless tobacco: Never  Vaping Use   Vaping Use: Never used  Substance Use Topics   Alcohol use: Never   Drug use: Never         OPHTHALMIC EXAM:  Base Eye Exam     Visual Acuity (ETDRS)       Right Left   Dist [place] 20/40 20/25   Dist [place] 20/30     Correction: Glasses         Tonometry (Tonopen, 3:52 PM)       Right Left   Pressure 10 14         Pupils       Pupils Shape APD   Right PERRL Round None   Left PERRL           Visual Fields       Left Right    Full Full         Extraocular  Movement       Right Left    Full, Ortho Full, Ortho         Neuro/Psych     Oriented x3: Yes   Mood/Affect: Normal         Dilation     Right eye: 2.5% Phenylephrine, 1.0% Mydriacyl @ 4:31 PM           Slit Lamp and Fundus Exam     External Exam       Right Left   External Normal Normal         Slit Lamp Exam       Right Left   Lids/Lashes Normal Normal   Conjunctiva/Sclera White and quiet White and quiet   Cornea Clear Clear   Anterior Chamber Deep and quiet Deep and quiet   Iris Round and reactive Round and reactive   Lens Centered posterior chamber intraocular lens Centered posterior chamber intraocular lens   Anterior Vitreous Normal Normal         Fundus Exam       Right Left   Posterior Vitreous Posterior vitreous detachment, Centered posterior chamber intraocular lens    Disc Normal    C/D Ratio 0.6    Macula Intermediate soft drusen, Macular thickening temporally    Vessels Normal    Periphery Normal             IMAGING AND PROCEDURES  Imaging and Procedures for 09/15/21  OCT, Retina - OU - Both Eyes       Right Eye Quality was good. Scan locations included subfoveal. Central Foveal Thickness: 237. Progression has been stable. Findings include abnormal foveal contour, subretinal hyper-reflective material, choroidal neovascular membrane, cystoid macular edema, subretinal fluid.   Left Eye Scan locations included subfoveal. Central Foveal Thickness: 256. Progression has been stable. Findings include no IRF, abnormal foveal contour, retinal drusen .   Notes OD with recent onset subretinal fluid signs of CNVM With RPE detachment temporally, as of October 2022, now improved postinjection of Avastin with smaller pigment epithelial detachment and serous retinal detachment, yet still active at 6-week interval repeat today and maintain 6-week interval  OS persistent unchanged subfoveal drusen, no sign of CNVM     Intravitreal  Injection, Pharmacologic Agent - OD - Right Eye       Time Out 09/15/2021. 4:32 PM. Confirmed correct patient, procedure, site, and  patient consented.   Anesthesia Subconjunctival anesthesia was used. Anesthetic medications included Lidocaine 4%.   Procedure Preparation included 5% betadine to ocular surface, 10% betadine to eyelids, Tobramycin 0.3%. A 30 gauge needle was used.   Injection: 2 mg aflibercept 2 MG/0.05ML   Route: Intravitreal, Site: Right Eye   NDC: [id number], Lot: [id number], Expiration date: 02/13/2022, Waste: 0 mL   Post-op Post injection exam found visual acuity of at least counting fingers. The patient tolerated the procedure well. There were no complications. The patient received written and verbal post procedure care education. Post injection medications included ocuflox.              ASSESSMENT/PLAN:  Exudative age-related macular degeneration of right eye with active choroidal neovascularization ([place]) OD heralded by vascularized PED and subretinal fluid.  Now continued improvement on intravitreal Eylea.  PED largely temporal to the fovea has nearly completely resolved as of this date.  Persistent treatment is required we will repeat injection today at 7-week interval and follow-up next in 7 weeks  Intermediate stage nonexudative age-related macular degeneration of both eyes No sign of CNVM OS by OCT      ICD-10-CM   1. Exudative age-related macular degeneration of right eye with active choroidal neovascularization (HCC)  H35.3211 OCT, Retina - OU - Both Eyes    Intravitreal Injection, Pharmacologic Agent - OD - Right Eye    aflibercept (EYLEA) SOLN 2 mg    2. Intermediate stage nonexudative age-related macular degeneration of both eyes  H35.3132       1.  2.  3.  Ophthalmic Meds Ordered this visit:  Meds ordered this encounter  Medications   aflibercept (EYLEA) SOLN 2 mg       Return in about 7 weeks (around 11/03/2021) for dilate, OD,  EYLEA OCT.  There are no Patient Instructions on file for this visit.   Explained the diagnoses, plan, and follow up with the patient and they expressed understanding.  Patient expressed understanding of the importance of proper follow up care.   Clent Demark Tylene Quashie M.D. Diseases & Surgery of the Retina and Vitreous [facility] 09/15/21     Abbreviations: M myopia (nearsighted); A astigmatism; H hyperopia (farsighted); P presbyopia; Mrx spectacle prescription;  CTL contact lenses; OD right eye; OS left eye; OU both eyes  XT exotropia; ET esotropia; PEK punctate epithelial keratitis; PEE punctate epithelial erosions; DES dry eye syndrome; MGD meibomian gland dysfunction; ATs artificial tears; PFAT's preservative free artificial tears; [place] nuclear sclerotic cataract; PSC posterior subcapsular cataract; ERM epi-retinal membrane; PVD posterior vitreous detachment; RD retinal detachment; DM diabetes mellitus; DR diabetic retinopathy; NPDR non-proliferative diabetic retinopathy; PDR proliferative diabetic retinopathy; CSME clinically significant macular edema; DME diabetic macular edema; dbh dot blot hemorrhages; CWS cotton wool spot; POAG primary open angle glaucoma; C/D cup-to-disc ratio; HVF humphrey visual field; GVF goldmann visual field; OCT optical coherence tomography; IOP intraocular pressure; BRVO Branch retinal vein occlusion; CRVO central retinal vein occlusion; CRAO central retinal artery occlusion; BRAO branch retinal artery occlusion; RT retinal tear; SB scleral buckle; PPV pars plana vitrectomy; VH Vitreous hemorrhage; PRP panretinal laser photocoagulation; IVK intravitreal kenalog; VMT vitreomacular traction; MH Macular hole;  NVD neovascularization of the disc; NVE neovascularization elsewhere; AREDS age related eye disease study; ARMD age related macular degeneration; POAG primary open angle glaucoma; EBMD epithelial/anterior basement membrane dystrophy; ACIOL anterior  chamber intraocular lens; IOL intraocular lens; PCIOL posterior chamber intraocular lens; Phaco/IOL phacoemulsification with intraocular lens placement; [place] photorefractive keratectomy;  LASIK laser assisted in situ keratomileusis; HTN hypertension; DM diabetes mellitus; COPD chronic obstructive pulmonary disease

## 2021-09-15 NOTE — Assessment & Plan Note (Addendum)
OD heralded by vascularized PED and subretinal fluid.  Now continued improvement on intravitreal Eylea.  PED largely temporal to the fovea has nearly completely resolved as of this date.  Persistent treatment is required we will repeat injection today at 7-week interval and follow-up next in 7 weeks

## 2021-09-15 NOTE — Assessment & Plan Note (Signed)
No sign of CNVM OS by OCT 

## 2021-11-03 ENCOUNTER — Encounter (INDEPENDENT_AMBULATORY_CARE_PROVIDER_SITE_OTHER): Payer: 59 | Admitting: Ophthalmology

## 2021-11-03 ENCOUNTER — Encounter (INDEPENDENT_AMBULATORY_CARE_PROVIDER_SITE_OTHER): Payer: Self-pay | Admitting: Ophthalmology

## 2021-11-03 ENCOUNTER — Ambulatory Visit (INDEPENDENT_AMBULATORY_CARE_PROVIDER_SITE_OTHER): Payer: 59 | Admitting: Ophthalmology

## 2021-11-03 DIAGNOSIS — H348112 Central retinal vein occlusion, right eye, stable: Secondary | ICD-10-CM

## 2021-11-03 DIAGNOSIS — H353211 Exudative age-related macular degeneration, right eye, with active choroidal neovascularization: Secondary | ICD-10-CM

## 2021-11-03 DIAGNOSIS — H353132 Nonexudative age-related macular degeneration, bilateral, intermediate dry stage: Secondary | ICD-10-CM

## 2021-11-03 MED ORDER — AFLIBERCEPT 2MG/0.05ML IZ SOLN FOR KALEIDOSCOPE
2.0000 mg | INTRAVITREAL | Status: AC | PRN
  Administered 2021-11-03: 2 mg via INTRAVITREAL

## 2021-11-03 NOTE — Assessment & Plan Note (Signed)
Distant past no sign of recurrence

## 2021-11-03 NOTE — Patient Instructions (Signed)
Patient instructed to call 1 week prior to next visit to confirm participation with [employer]

## 2021-11-03 NOTE — Assessment & Plan Note (Signed)
No sign of CNVM OS 

## 2021-11-03 NOTE — Progress Notes (Signed)
11/03/2021     CHIEF COMPLAINT Patient presents for  Chief Complaint  Patient presents with   Macular Degeneration      HISTORY OF PRESENT ILLNESS: Briana Hines is a 66 y.o. female who presents to the clinic today for:   HPI   7 weeks for DILATE OD, EYLEA, OCT. Pt stated vision is stable.  Last edited by [name] on 11/03/2021  3:38 PM.      Referring physician: Jerl Mina, MD [address] [facility] [place],  [place] [address]  HISTORICAL INFORMATION:   Selected notes from the MEDICAL RECORD NUMBER       CURRENT MEDICATIONS: No current outpatient medications on file. (Ophthalmic Drugs)   No current facility-administered medications for this visit. (Ophthalmic Drugs)   Current Outpatient Medications (Other)  Medication Sig   amLODipine (NORVASC) 5 MG tablet Take 5 mg by mouth daily.   aspirin EC 81 MG tablet Take 81 mg by mouth daily.   Cyanocobalamin (B-12) 1000 MCG CAPS Take 1,000 mcg by mouth daily.    metoprolol succinate (TOPROL-XL) 25 MG 24 hr tablet Take 25 mg by mouth at bedtime.    Multiple Vitamin (MULTI-VITAMINS) TABS Take 1 tablet by mouth 3 (three) times a week.    Multiple Vitamins-Minerals (ICAPS AREDS 2 PO) Take 1 tablet by mouth 2 (two) times daily.    Omega-3 Fatty Acids (FISH OIL) 1200 MG CAPS Take 1,200 mg by mouth daily.    omeprazole (PRILOSEC) 40 MG capsule Take 1 capsule (40 mg total) by mouth daily.   No current facility-administered medications for this visit. (Other)      REVIEW OF SYSTEMS: ROS   Negative for: Constitutional, Gastrointestinal, Neurological, Skin, Genitourinary, Musculoskeletal, HENT, Endocrine, Cardiovascular, Eyes, Respiratory, Psychiatric, Allergic/Imm, Heme/Lymph Last edited by [name] on 11/03/2021  3:38 PM.       ALLERGIES No Known Allergies  PAST MEDICAL HISTORY Past Medical History:  Diagnosis Date   Complication of anesthesia    CRVO (central retinal vein occlusion)    GERD  (gastroesophageal reflux disease)    Glaucoma    History of hiatal hernia    Hypertension    Macular degeneration    PONV (postoperative nausea and vomiting)    dry heaves in 2011 only   Past Surgical History:  Procedure Laterality Date   BREAST BIOPSY Left 1990's   benign   CATARACT EXTRACTION Bilateral    CATARACT EXTRACTION, BILATERAL  09/2017   COLONOSCOPY WITH PROPOFOL N/A 05/16/2021   Procedure: COLONOSCOPY WITH PROPOFOL;  Surgeon: Briana Bill, MD;  Location: [facility];  Service: Endoscopy;  Laterality: N/A;   HERNIA REPAIR  2010   hiatal, incisional   IRIDOTOMY / IRIDECTOMY     ROBOTIC ASSISTED LAPAROSCOPIC REPAIR OF PARAESOPHAGEAL HERNIA N/A 12/18/2017   Procedure: ATTEMPTED ROBOTIC ASSISTED LAPAROSCOPIC REPAIR OF PARAESOPHAGEAL HERNIA;  Surgeon: Briana Ro, MD;  Location: [facility] ORS;  Service: General;  Laterality: N/A;   TONSILLECTOMY     age 5   VENTRAL HERNIA REPAIR  12/18/2017   Procedure: HERNIA REPAIR VENTRAL ADULT;  Surgeon: Briana Ro, MD;  Location: [facility] ORS;  Service: General;;    FAMILY HISTORY Family History  Problem Relation Age of Onset   COPD Mother    Cancer Mother        not sure what type   Hypertension Mother    COPD Father    Heart attack Brother    High blood pressure Brother  Breast cancer Neg Hx     SOCIAL HISTORY Social History   Tobacco Use   Smoking status: Never   Smokeless tobacco: Never  Vaping Use   Vaping Use: Never used  Substance Use Topics   Alcohol use: Never   Drug use: Never         OPHTHALMIC EXAM:  Base Eye Exam     Visual Acuity (ETDRS)       Right Left   Dist [place] 20/40 -2 20/30 -2   Dist ph [place] NI 20/25         Tonometry (Tonopen, 3:45 PM)       Right Left   Pressure 14 15         Pupils       Pupils APD   Right PERRL None   Left PERRL None         Visual Fields       Left Right    Full Full         Extraocular Movement       Right Left    Full, Ortho  Full, Ortho         Neuro/Psych     Oriented x3: Yes   Mood/Affect: Normal         Dilation     Right eye: 2.5% Phenylephrine, 1.0% Mydriacyl @ 3:45 PM           Slit Lamp and Fundus Exam     External Exam       Right Left   External Normal Normal         Slit Lamp Exam       Right Left   Lids/Lashes Normal Normal   Conjunctiva/Sclera White and quiet White and quiet   Cornea Clear Clear   Anterior Chamber Deep and quiet Deep and quiet   Iris Round and reactive Round and reactive   Lens Centered posterior chamber intraocular lens Centered posterior chamber intraocular lens   Anterior Vitreous Normal Normal         Fundus Exam       Right Left   Posterior Vitreous Posterior vitreous detachment, Centered posterior chamber intraocular lens    Disc Normal    C/D Ratio 0.6    Macula Intermediate soft drusen, Macular thickening temporally    Vessels Normal    Periphery Normal             IMAGING AND PROCEDURES  Imaging and Procedures for 11/03/21  OCT, Retina - OU - Both Eyes       Right Eye Quality was good. Scan locations included subfoveal. Central Foveal Thickness: 232. Progression has been stable. Findings include abnormal foveal contour, subretinal hyper-reflective material, choroidal neovascular membrane, cystoid macular edema, subretinal fluid.   Left Eye Scan locations included subfoveal. Central Foveal Thickness: 256. Progression has been stable. Findings include no IRF, abnormal foveal contour, retinal drusen .   Notes OD with recent onset subretinal fluid signs of CNVM With RPE detachment temporally, as of October 2022, now improved postinjection of Eylea with smaller pigment epithelial detachment and serous retinal detachment, improved overall today at 7 -week interval repeat today and maintain 8-week interval  OS persistent unchanged subfoveal drusen, no sign of CNVM     Intravitreal Injection, Pharmacologic Agent - OD - Right  Eye       Time Out 11/03/2021. 4:24 PM. Confirmed correct patient, procedure, site, and patient consented.   Anesthesia Subconjunctival anesthesia was used. Anesthetic medications  included Lidocaine 4%.   Procedure Preparation included 5% betadine to ocular surface, 10% betadine to eyelids, Tobramycin 0.3%. A 30 gauge needle was used.   Injection: 2 mg aflibercept 2 MG/0.05ML   Route: Intravitreal, Site: Right Eye   NDC: [id number], Lot: [id number], Expiration date: 10/16/2022, Waste: 0 mL   Post-op Post injection exam found visual acuity of at least counting fingers. The patient tolerated the procedure well. There were no complications. The patient received written and verbal post procedure care education. Post injection medications included ocuflox.              ASSESSMENT/PLAN:  Exudative age-related macular degeneration of right eye with active choroidal neovascularization (HCC) The nature of wet macular degeneration was discussed with the patient.  Forms of therapy reviewed include the use of Anti-VEGF medications injected painlessly into the eye, as well as other possible treatment modalities, including thermal laser therapy. Fellow eye involvement and risks were discussed with the patient. Upon the finding of wet age related macular degeneration, treatment will be offered. The treatment regimen is on a treat as needed basis with the intent to treat if necessary and extend interval of exams when possible. On average 1 out of 6 patients do not need lifetime therapy. However, the risk of recurrent disease is high for a lifetime.  Initially monthly, then periodic, examinations and evaluations will determine whether the next treatment is required on the day of the examination.  OD vastly improved overall post injection Eylea.  Doing very well today at 7-week interval repeat injection today and extend interval next 8 weeks  Stable central retinal vein occlusion of right eye Distant  past no sign of recurrence  Intermediate stage nonexudative age-related macular degeneration of both eyes No sign of CNVM OS      ICD-10-CM   1. Exudative age-related macular degeneration of right eye with active choroidal neovascularization (HCC)  H35.3211 OCT, Retina - OU - Both Eyes    Intravitreal Injection, Pharmacologic Agent - OD - Right Eye    aflibercept (EYLEA) SOLN 2 mg    2. Stable central retinal vein occlusion of right eye  H34.8112     3. Intermediate stage nonexudative age-related macular degeneration of both eyes  H35.3132       1.  OD doing very well improving now on Eylea.  Previous resistance to Avastin.  Currently at 7-week interval.  Repeat injection today reevaluate next in 8 weeks  2.  3.  Ophthalmic Meds Ordered this visit:  Meds ordered this encounter  Medications   aflibercept (EYLEA) SOLN 2 mg       Return in about 8 weeks (around 12/29/2021) for dilate, OD, EYLEA OCT.  Patient Instructions  Patient instructed to call 1 week prior to next visit to confirm participation with [place] the diagnoses, plan, and follow up with the patient and they expressed understanding.  Patient expressed understanding of the importance of proper follow up care.   Clent Demark Marcella Charlson M.D. Diseases & Surgery of the Retina and Vitreous [facility] 11/03/21     Abbreviations: M myopia (nearsighted); A astigmatism; H hyperopia (farsighted); P presbyopia; Mrx spectacle prescription;  CTL contact lenses; OD right eye; OS left eye; OU both eyes  XT exotropia; ET esotropia; PEK punctate epithelial keratitis; PEE punctate epithelial erosions; DES dry eye syndrome; MGD meibomian gland dysfunction; ATs artificial tears; PFAT's preservative free artificial tears; [place] nuclear sclerotic cataract; PSC posterior subcapsular cataract; ERM epi-retinal membrane; PVD  posterior vitreous detachment; RD retinal detachment; DM diabetes mellitus; DR  diabetic retinopathy; NPDR non-proliferative diabetic retinopathy; PDR proliferative diabetic retinopathy; CSME clinically significant macular edema; DME diabetic macular edema; dbh dot blot hemorrhages; CWS cotton wool spot; POAG primary open angle glaucoma; C/D cup-to-disc ratio; HVF humphrey visual field; GVF goldmann visual field; OCT optical coherence tomography; IOP intraocular pressure; BRVO Branch retinal vein occlusion; CRVO central retinal vein occlusion; CRAO central retinal artery occlusion; BRAO branch retinal artery occlusion; RT retinal tear; SB scleral buckle; PPV pars plana vitrectomy; VH Vitreous hemorrhage; PRP panretinal laser photocoagulation; IVK intravitreal kenalog; VMT vitreomacular traction; MH Macular hole;  NVD neovascularization of the disc; NVE neovascularization elsewhere; AREDS age related eye disease study; ARMD age related macular degeneration; POAG primary open angle glaucoma; EBMD epithelial/anterior basement membrane dystrophy; ACIOL anterior chamber intraocular lens; IOL intraocular lens; PCIOL posterior chamber intraocular lens; Phaco/IOL phacoemulsification with intraocular lens placement; PRK photorefractive keratectomy; LASIK laser assisted in situ keratomileusis; HTN hypertension; DM diabetes mellitus; COPD chronic obstructive pulmonary disease

## 2021-11-03 NOTE — Assessment & Plan Note (Signed)
The nature of wet macular degeneration was discussed with the patient.  Forms of therapy reviewed include the use of Anti-VEGF medications injected painlessly into the eye, as well as other possible treatment modalities, including thermal laser therapy. Fellow eye involvement and risks were discussed with the patient. Upon the finding of wet age related macular degeneration, treatment will be offered. The treatment regimen is on a treat as needed basis with the intent to treat if necessary and extend interval of exams when possible. On average 1 out of 6 patients do not need lifetime therapy. However, the risk of recurrent disease is high for a lifetime.  Initially monthly, then periodic, examinations and evaluations will determine whether the next treatment is required on the day of the examination.  OD vastly improved overall post injection Eylea.  Doing very well today at 7-week interval repeat injection today and extend interval next 8 weeks

## 2021-12-29 ENCOUNTER — Encounter (INDEPENDENT_AMBULATORY_CARE_PROVIDER_SITE_OTHER): Payer: 59 | Admitting: Ophthalmology

## 2022-06-02 ENCOUNTER — Ambulatory Visit
Admission: EM | Admit: 2022-06-02 | Discharge: 2022-06-02 | Disposition: A | Discharge status: Home / Self Care | Payer: 59 | Attending: Internal Medicine | Admitting: Internal Medicine

## 2022-06-02 DIAGNOSIS — S90562A Insect bite (nonvenomous), left ankle, initial encounter: Secondary | ICD-10-CM

## 2022-06-02 DIAGNOSIS — W57XXXA Bitten or stung by nonvenomous insect and other nonvenomous arthropods, initial encounter: Secondary | ICD-10-CM | POA: Diagnosis not present

## 2022-06-02 DIAGNOSIS — L03116 Cellulitis of left lower limb: Secondary | ICD-10-CM | POA: Diagnosis not present

## 2022-06-02 DIAGNOSIS — M25472 Effusion, left ankle: Secondary | ICD-10-CM

## 2022-06-02 MED ORDER — FAMOTIDINE 20 MG PO TABS: 20.0000 mg | ORAL_TABLET | Freq: Two times a day (BID) | ORAL | 0 refills | Status: AC

## 2022-06-02 MED ORDER — DOXYCYCLINE HYCLATE 100 MG PO CAPS: 100.0000 mg | ORAL_CAPSULE | Freq: Two times a day (BID) | ORAL | 0 refills | Status: DC

## 2022-06-02 MED ORDER — CETIRIZINE HCL 10 MG PO TABS: 10.0000 mg | ORAL_TABLET | Freq: Every day | ORAL | 0 refills | Status: AC

## 2022-06-02 NOTE — ED Triage Notes (Signed)
Pt presents to [facility] with co of insect bite to left ankle since Tuesday with swelling, discomfort and redness, and itchiness Pt has been using triamcinalone cream and tylenol for at home.

## 2022-06-02 NOTE — Discharge Instructions (Signed)
I have prescribed an antihistamine called Zyrtec and Pepcid which will help alleviate localized allergic reaction.  I have also prescribed an antibiotic which you will need to take with food to avoid stomach upset given concern of bacterial infection.  Continue cool compresses.  Monitor closely for any increased redness, swelling, pus and follow-up sooner if this happens.

## 2022-06-02 NOTE — ED Provider Notes (Signed)
[facility]    CSN: [id number] Arrival date & time: 06/02/22  1652      History   Chief Complaint Chief Complaint  Patient presents with   Insect Bite    excessive swelling in left foot and leg,  looks like something has bitten me 3rd day now and swelling hasn't gone down - Entered by patient    HPI Briana Hines is a 67 y.o. female.   Patient presents with concern of insect bite to left medial ankle that she noticed about 4 days ago.  Patient is not sure what may have bitten her as she did not witness or feel it.  She states that the swelling has increased and it feels very warm.  She denies fever, body aches, chills, nausea, vomiting.  Patient has been using topical triamcinolone cream and Benadryl with no improvement in symptoms.     Past Medical History:  Diagnosis Date   Complication of anesthesia    CRVO (central retinal vein occlusion)    GERD (gastroesophageal reflux disease)    Glaucoma    History of hiatal hernia    Hypertension    Macular degeneration    PONV (postoperative nausea and vomiting)    dry heaves in 2011 only    Patient Active Problem List   Diagnosis Date Noted   Nonexudative age-related macular degeneration, bilateral, early dry stage 12/06/2020   Stable central retinal vein occlusion of right eye 12/06/2020   Exudative age-related macular degeneration of right eye with active choroidal neovascularization 12/06/2020   Intermediate stage nonexudative age-related macular degeneration of both eyes 12/06/2020   PONV (postoperative nausea and vomiting) 12/24/2017   S/P repair of paraesophageal hernia 12/18/2017   Hiatal hernia with gastroesophageal reflux    CRVO (central retinal vein occlusion) 10/08/2017   Hiatal hernia 05/20/2013   Anemia, unspecified 05/20/2013   History of pneumonia 05/20/2013    Past Surgical History:  Procedure Laterality Date   BREAST BIOPSY Left 1990's   benign   CATARACT EXTRACTION Bilateral     CATARACT EXTRACTION, BILATERAL  09/2017   COLONOSCOPY WITH PROPOFOL N/A 05/16/2021   Procedure: COLONOSCOPY WITH PROPOFOL;  Surgeon: Regis Bill, MD;  Location: [facility];  Service: Endoscopy;  Laterality: N/A;   HERNIA REPAIR  2010   hiatal, incisional   IRIDOTOMY / IRIDECTOMY     ROBOTIC ASSISTED LAPAROSCOPIC REPAIR OF PARAESOPHAGEAL HERNIA N/A 12/18/2017   Procedure: ATTEMPTED ROBOTIC ASSISTED LAPAROSCOPIC REPAIR OF PARAESOPHAGEAL HERNIA;  Surgeon: Leafy Ro, MD;  Location: [facility] OR[facility];  Service: General;  Laterality: N/A;   TONSILLECTOMY     age 24   VENTRAL HERNIA REPAIR  12/18/2017   Procedure: HERNIA REPAIR VENTRAL ADULT;  Surgeon: Leafy Ro, MD;  Location: [facility] OR[facility];  Service: General;;    OB History   No obstetric history on file.      Home Medications    Prior to Admission medications   Medication Sig Start Date End Date Taking? Authorizing Provider  cetirizine (ZYRTEC) 10 MG tablet Take 1 tablet (10 mg total) by mouth daily. 06/02/22  Yes Quynn Vilchis, Acie Fredrickson, FNP  doxycycline (VIBRAMYCIN) 100 MG capsule Take 1 capsule (100 mg total) by mouth 2 (two) times daily. 06/02/22  Yes Branda Chaudhary, Acie Fredrickson, FNP  famotidine (PEPCID) 20 MG tablet Take 1 tablet (20 mg total) by mouth 2 (two) times daily. 06/02/22  Yes Maripaz Mullan, Rolly Salter E, FNP  amLODipine (NORVASC) 5 MG tablet Take 5 mg by mouth daily.  [provider]  aspirin EC 81 MG tablet Take 81 mg by mouth daily.    [provider]  Cyanocobalamin (B-12) 1000 MCG CAPS Take 1,000 mcg by mouth daily.     [provider]  metoprolol succinate (TOPROL-XL) 25 MG 24 hr tablet Take 25 mg by mouth at bedtime.  08/31/16   [provider]  Multiple Vitamin (MULTI-VITAMINS) TABS Take 1 tablet by mouth 3 (three) times a week.     [provider]  Multiple Vitamins-Minerals (ICAPS AREDS 2 PO) Take 1 tablet by mouth 2 (two) times daily.     [provider]  Omega-3 Fatty Acids (FISH OIL) 1200  MG CAPS Take 1,200 mg by mouth daily.     [provider]  omeprazole (PRILOSEC) 40 MG capsule Take 1 capsule (40 mg total) by mouth daily. 12/28/17   Donovan Kail, PA-C    Family History Family History  Problem Relation Age of Onset   COPD Mother    Cancer Mother        not sure what type   Hypertension Mother    COPD Father    Heart attack Brother    High blood pressure Brother    Breast cancer Neg Hx     Social History Social History   Tobacco Use   Smoking status: Never   Smokeless tobacco: Never  Vaping Use   Vaping Use: Never used  Substance Use Topics   Alcohol use: Never   Drug use: Never     Allergies   Patient has no known allergies.   Review of Systems Review of Systems Per HPI  Physical Exam Triage Vital Signs ED Triage Vitals [06/02/22 1711]  Enc Vitals Group     BP 133/74     Pulse Rate 67     Resp 19     Temp 97.8 F (36.6 C)     Temp Source Oral     SpO2 98 %     Weight      Height      Head Circumference      Peak Flow      Pain Score 6     Pain Loc      Pain Edu?      Excl. in GC?    No data found.  Updated Vital Signs BP 133/74   Pulse 67   Temp 97.8 F (36.6 C) (Oral)   Resp 19   SpO2 98%   Visual Acuity Right Eye Distance:   Left Eye Distance:   Bilateral Distance:    Right Eye Near:   Left Eye Near:    Bilateral Near:     Physical Exam Constitutional:      General: She is not in acute distress.    Appearance: Normal appearance. She is not toxic-appearing or diaphoretic.  HENT:     Head: Normocephalic and atraumatic.  Eyes:     Extraocular Movements: Extraocular movements intact.     Conjunctiva/sclera: Conjunctivae normal.  Pulmonary:     Effort: Pulmonary effort is normal.  Skin:    Comments: Patient has area of erythema that is consistent with insect or spider bit that is approximately an inch long that is elongated and superficial present to left medial ankle. there is no purulent  drainage.  Swelling that is nonpitting surrounding ankle and localized surrounding this area.  Warm to touch.  Full range of motion of ankle present.  Capillary refill and pulses are intact.  Neurological:     General: No focal deficit present.     Mental Status: She is alert and oriented to person, place, and time. Mental status is at baseline.  Psychiatric:        Mood and Affect: Mood normal.        Behavior: Behavior normal.        Thought Content: Thought content normal.        Judgment: Judgment normal.      UC Treatments / Results  Labs (all labs ordered are listed, but only abnormal results are displayed) Labs Reviewed - No data to display  EKG   Radiology No results found.  Procedures Procedures (including critical care time)  Medications Ordered in UC Medications - No data to display  Initial Impression / Assessment and Plan / UC Course  I have reviewed the triage vital signs and the nursing notes.  Pertinent labs & imaging results that were available during my care of the patient were reviewed by me and considered in my medical decision making (see chart for details).     Differential diagnoses include insect versus spider bite.  I am mildly concerned about cellulitis given appearance on physical exam so will treat with doxycycline.  No concern for septic joint at this time.  Patient is neurovascularly intact as well which is reassuring.  Will avoid steroid therapy given patient's history of glaucoma.  Will treat with antihistamine and Pepcid as patient denies that she takes any of these daily.  Advised monitoring closely and follow-up if any symptoms persist or worsen.  Patient denies any systemic symptoms as well which is reassuring.  Patient verbalized understanding and was agreeable with plan. Final Clinical Impressions(s) / UC Diagnoses   Final diagnoses:  Insect bite of left ankle, initial encounter  Ankle swelling, left  Cellulitis of left ankle      Discharge Instructions      I have prescribed an antihistamine called Zyrtec and Pepcid which will help alleviate localized allergic reaction.  I have also prescribed an antibiotic which you will need to take with food to avoid stomach upset given concern of bacterial infection.  Continue cool compresses.  Monitor closely for any increased redness, swelling, pus and follow-up sooner if this happens.    ED Prescriptions     Medication Sig Dispense Auth. Provider   doxycycline (VIBRAMYCIN) 100 MG capsule Take 1 capsule (100 mg total) by mouth 2 (two) times daily. 20 capsule [place], [place] E, [place]   cetirizine (ZYRTEC) 10 MG tablet Take 1 tablet (10 mg total) by mouth daily. 30 tablet [place], [place] E, [place]   famotidine (PEPCID) 20 MG tablet Take 1 tablet (20 mg total) by mouth 2 (two) times daily. 30 tablet [place], Acie Fredrickson, [place]      PDMP not reviewed this encounter.   Gustavus Bryant, [place] 06/02/22 [phone]

## 2022-07-13 ENCOUNTER — Other Ambulatory Visit: Payer: Self-pay | Admitting: Family Medicine

## 2022-07-13 DIAGNOSIS — Z1231 Encounter for screening mammogram for malignant neoplasm of breast: Secondary | ICD-10-CM

## 2022-07-27 ENCOUNTER — Ambulatory Visit
Admission: RE | Admit: 2022-07-27 | Discharge: 2022-07-27 | Disposition: A | Discharge status: Home / Self Care | Payer: 59 | Source: Ambulatory Visit | Attending: Family Medicine | Admitting: Family Medicine

## 2022-07-27 DIAGNOSIS — Z1231 Encounter for screening mammogram for malignant neoplasm of breast: Secondary | ICD-10-CM | POA: Insufficient documentation

## 2022-10-06 IMAGING — CT CT ABD-PELV W/ CM
1 of 3 series · 13 of 32 positions shown, 19 images · IV contrast (APPLIED)
Comparison: 09/26/2017

CLINICAL DATA: Generalized abdominal pain, possible hernia

EXAM:
CT ABDOMEN AND PELVIS WITH CONTRAST
TECHNIQUE: Multidetector CT imaging of the abdomen and pelvis was performed
using the standard protocol following bolus administration of
intravenous contrast.
CONTRAST:  100mL [id number] IOPAMIDOL ([id number]) INJECTION 61%

[Series 2: abd/pelvis w/cm · axial · 0.83mm/px · z∈[+360,+750]mm · 13 of 91 slices shown, 19 images]
[im 7/91  soft-tissue]
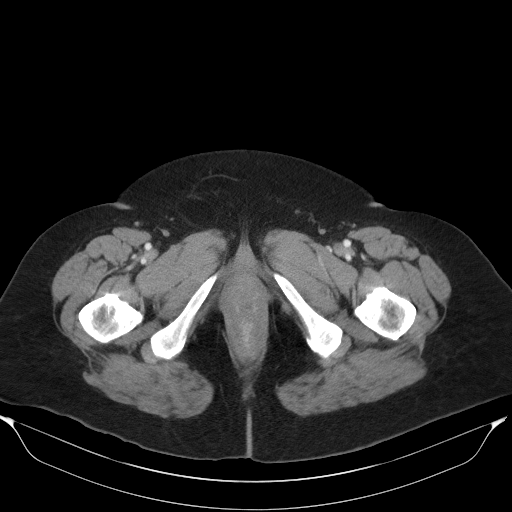
[im 7/91  bone]
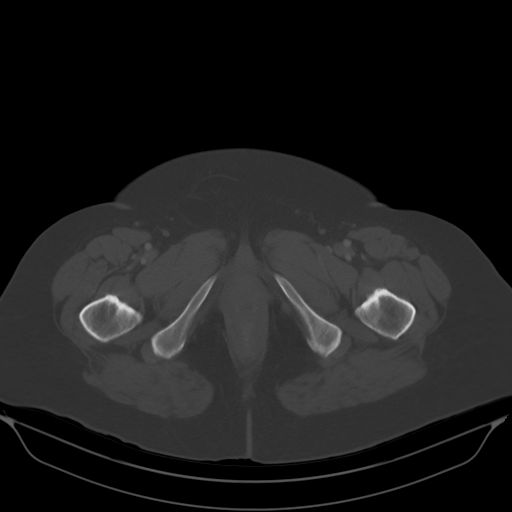
[im 13/91  soft-tissue]
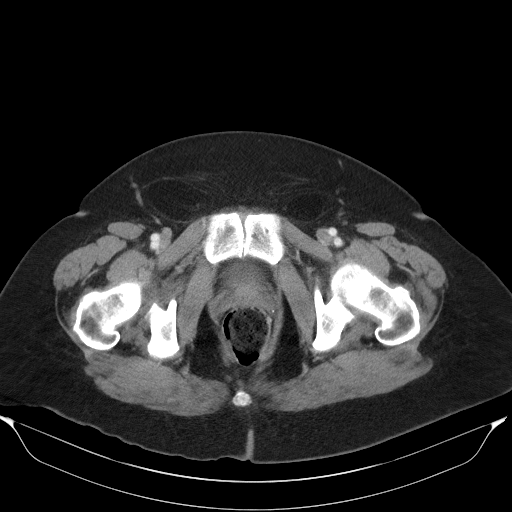
[im 19/91  soft-tissue]
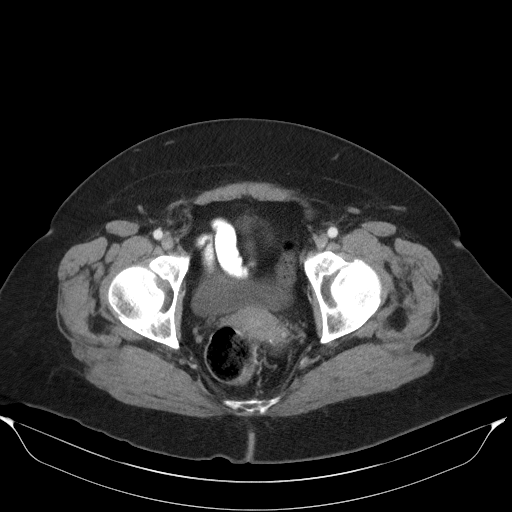
[im 25/91  soft-tissue]
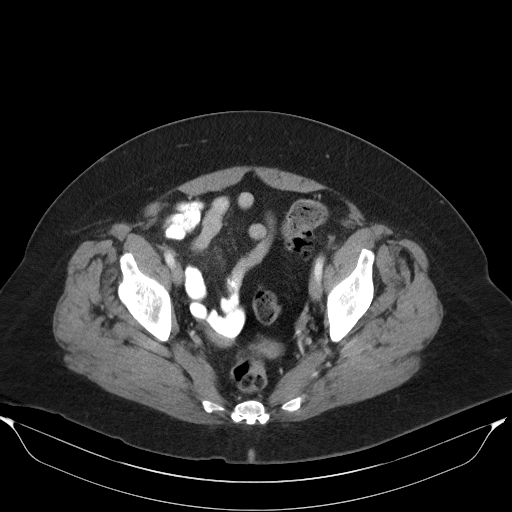
[im 31/91  soft-tissue]
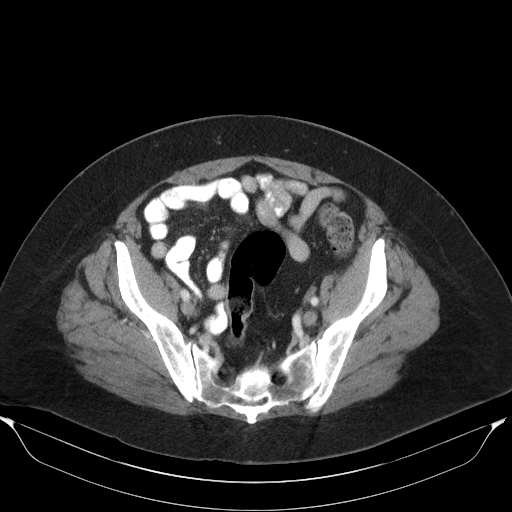
[im 37/91  soft-tissue]
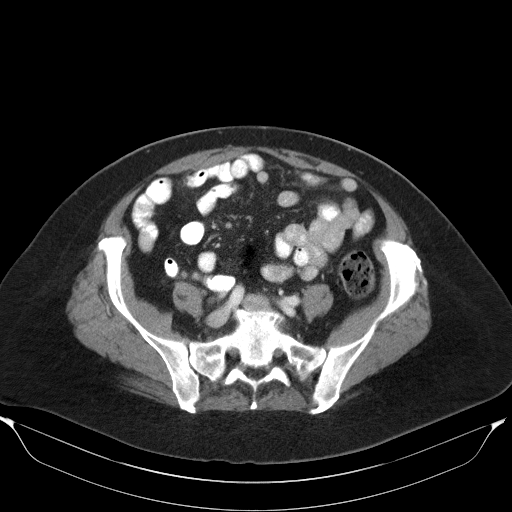
[im 49/91  soft-tissue]
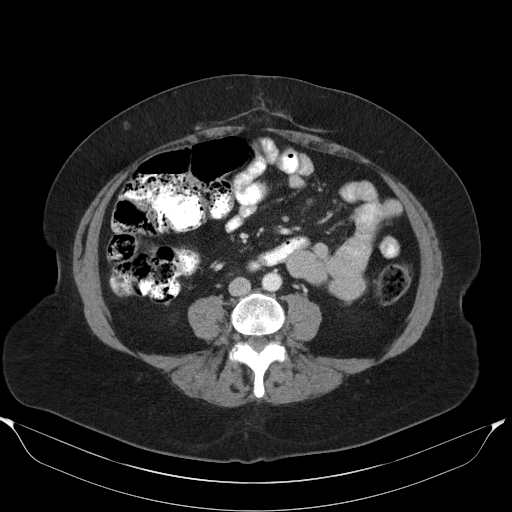
[im 55/91  soft-tissue]
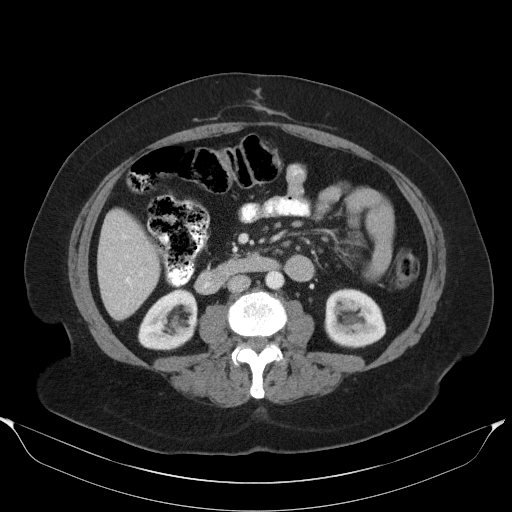
[im 61/91  soft-tissue]
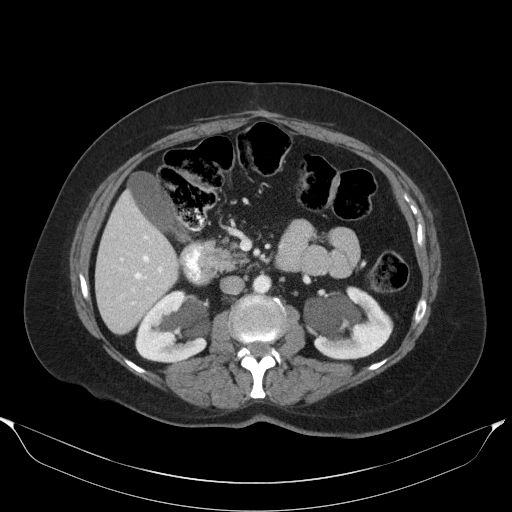
[im 61/91  bone]
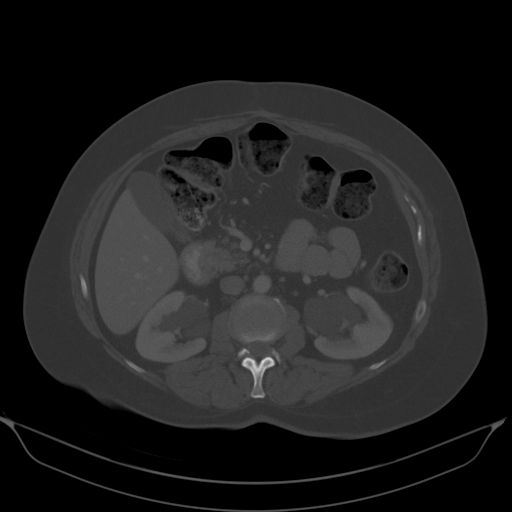
[im 67/91  soft-tissue]
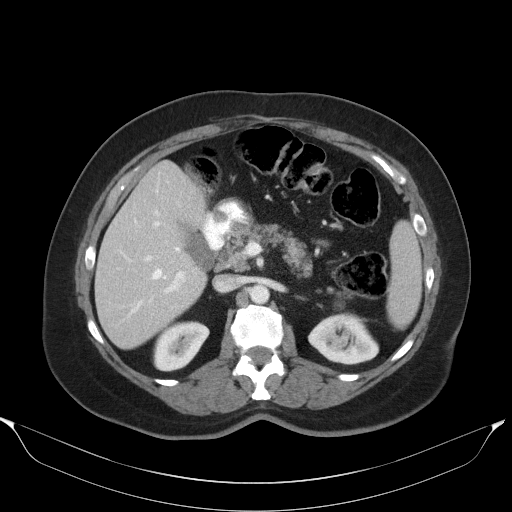
[im 67/91  lung]
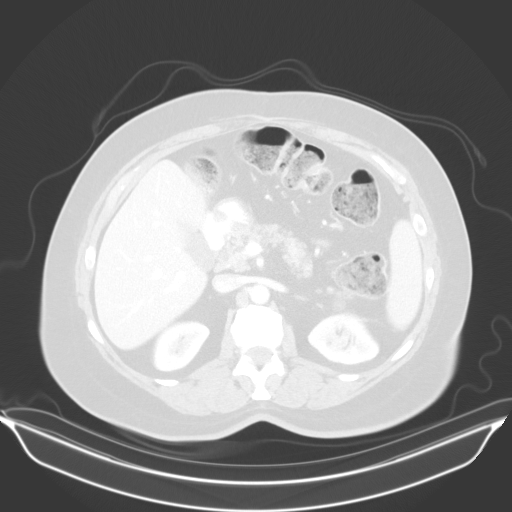
[im 73/91  soft-tissue]
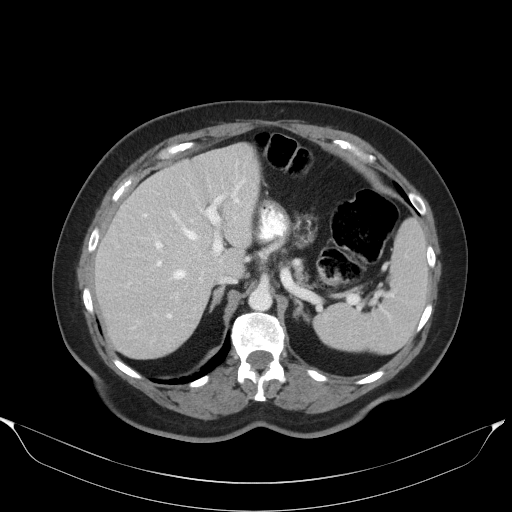
[im 73/91  lung]
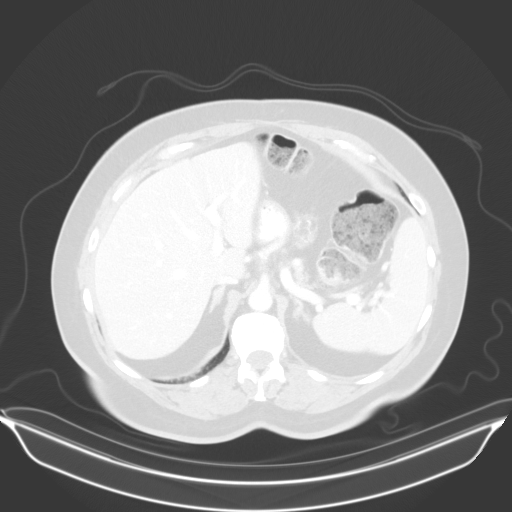
[im 79/91  soft-tissue]
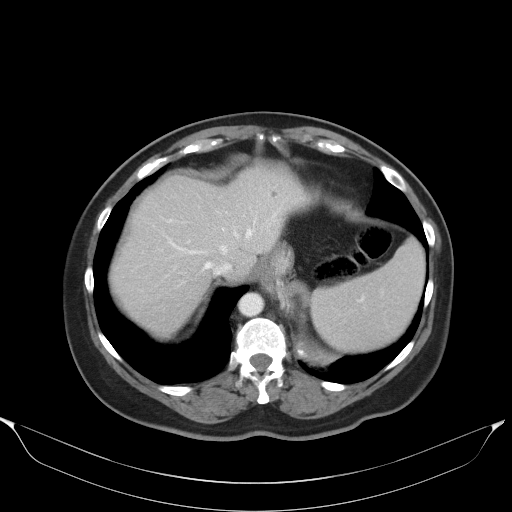
[im 79/91  lung]
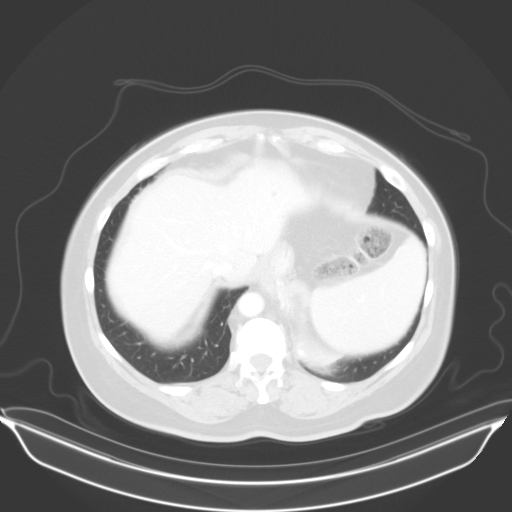
[im 85/91  soft-tissue]
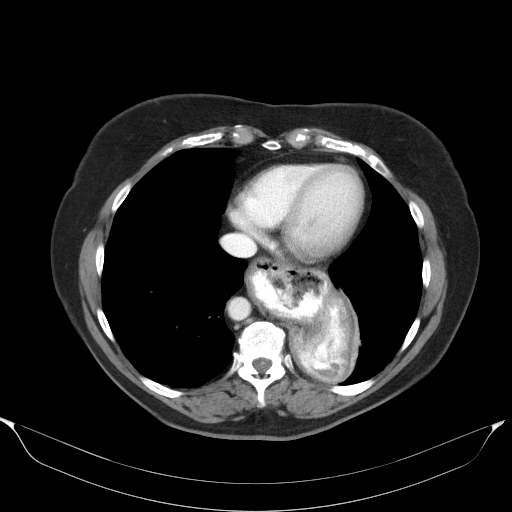
[im 85/91  lung]
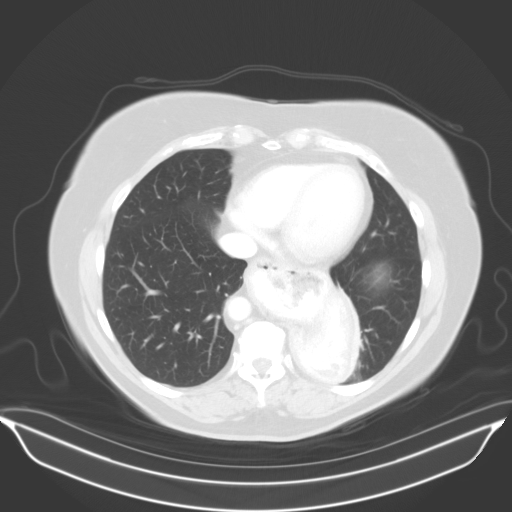

[13 of 32 positions shown; findings below may reference images not displayed]

FINDINGS: Lower chest: Large hiatal hernia. This is increased in size since
prior study. No acute abnormality.

Hepatobiliary: No focal hepatic abnormality. Gallbladder
unremarkable.

Pancreas: No focal abnormality or ductal dilatation.

Spleen: No focal abnormality.  Normal size.

Adrenals/Urinary Tract: Bilateral renal parapelvic cysts. No
hydronephrosis. Adrenal glands and urinary bladder unremarkable.

Stomach/Bowel: Sigmoid diverticulosis. No active diverticulitis.
Stomach and small bowel decompressed, unremarkable.

Vascular/Lymphatic: No evidence of aneurysm or adenopathy.

Reproductive: Uterus and adnexa unremarkable.  No mass.

Other: No free fluid or free air.

Musculoskeletal: Fatty expansion of the inguinal canals bilaterally
could reflect inguinal hernias. Prior ventral hernia repair with
decreasing size of the ventral hernia. Small recurrent or residual
ventral hernia present. No acute bony abnormality.
IMPRESSION: Large hiatal hernia, increasing in size since prior study.

Interval ventral hernia repair with small residual recurrent fat
containing ventral hernia.

Bilateral renal parapelvic cysts, stable.

No acute findings.

Fatty expansion of the inguinal canals could reflect inguinal
hernias.

## 2023-07-05 ENCOUNTER — Other Ambulatory Visit: Payer: Self-pay | Admitting: Family Medicine

## 2023-07-05 DIAGNOSIS — Z1231 Encounter for screening mammogram for malignant neoplasm of breast: Secondary | ICD-10-CM

## 2023-08-01 ENCOUNTER — Ambulatory Visit
Admission: RE | Admit: 2023-08-01 | Discharge: 2023-08-01 | Disposition: A | Discharge status: Home / Self Care | Source: Ambulatory Visit | Attending: Family Medicine | Admitting: Family Medicine

## 2023-08-01 DIAGNOSIS — Z1231 Encounter for screening mammogram for malignant neoplasm of breast: Secondary | ICD-10-CM | POA: Diagnosis present

## 2023-08-18 ENCOUNTER — Ambulatory Visit
Admission: RE | Admit: 2023-08-18 | Discharge: 2023-08-18 | Disposition: A | Discharge status: Home / Self Care | Source: Ambulatory Visit | Attending: Emergency Medicine

## 2023-08-18 VITALS — BP 134/83 | HR 82 | Temp 98.2°F | Resp 17

## 2023-08-18 DIAGNOSIS — J209 Acute bronchitis, unspecified: Secondary | ICD-10-CM

## 2023-08-18 MED ORDER — PROMETHAZINE-DM 6.25-15 MG/5ML PO SYRP: 2.5000 mL | ORAL_SOLUTION | Freq: Four times a day (QID) | ORAL | 0 refills | Status: AC | PRN

## 2023-08-18 MED ORDER — AZITHROMYCIN 250 MG PO TABS: 250.0000 mg | ORAL_TABLET | Freq: Every day | ORAL | 0 refills | Status: DC

## 2023-08-18 NOTE — ED Triage Notes (Signed)
 Pt c/o not feeling well, cough, congestion, and sinus pain since Wednesday.

## 2023-08-18 NOTE — ED Provider Notes (Signed)
 [facility]    CSN: [id number] Arrival date & time: 08/18/23  1010      History   Chief Complaint Chief Complaint  Patient presents with   Cough    Not feeling well, coughing a lot   sinus pain - Entered by patient    HPI Briana Hines is a 68 y.o. female.   Patient presents to clinic over concern of cough, congestion, postnasal drip and sinus pain and pressure.  Symptoms have been ongoing but worsening for the past 4 days.  Noticed that the cough is mild throughout the day, will occasionally be productive with phlegm.  Cough is worse at night, she is unable to sleep.  Patient already stays propped up at night due to hiatal hernia.   Has tried Mucinex and other over-the-counter remedies without much improvement.  Did try to work out in the yard yesterday to sweated out.  Has not had any chest pain, wheezing or shortness of breath.  No history of asthma or COPD.  Does not smoke.  Has not had any fevers.  Reports she is running about a degree higher from her usual temperature.   The history is provided by the patient and medical records.  Cough   Past Medical History:  Diagnosis Date   Complication of anesthesia    CRVO (central retinal vein occlusion)    GERD (gastroesophageal reflux disease)    Glaucoma    History of hiatal hernia    Hypertension    Macular degeneration    PONV (postoperative nausea and vomiting)    dry heaves in 2011 only    Patient Active Problem List   Diagnosis Date Noted   Nonexudative age-related macular degeneration, bilateral, early dry stage 12/06/2020   Stable central retinal vein occlusion of right eye 12/06/2020   Exudative age-related macular degeneration of right eye with active choroidal neovascularization (HCC) 12/06/2020   Intermediate stage nonexudative age-related macular degeneration of both eyes 12/06/2020   PONV (postoperative nausea and vomiting) 12/24/2017   S/P repair of paraesophageal hernia 12/18/2017   Hiatal  hernia with gastroesophageal reflux    CRVO (central retinal vein occlusion) 10/08/2017   Hiatal hernia 05/20/2013   Anemia, unspecified 05/20/2013   History of pneumonia 05/20/2013    Past Surgical History:  Procedure Laterality Date   BREAST BIOPSY Left 1990's   benign   CATARACT EXTRACTION Bilateral    CATARACT EXTRACTION, BILATERAL  09/2017   COLONOSCOPY WITH PROPOFOL  N/A 05/16/2021   Procedure: COLONOSCOPY WITH PROPOFOL ;  Surgeon: Maryruth Ole DASEN, MD;  Location: [facility];  Service: Endoscopy;  Laterality: N/A;   HERNIA REPAIR  2010   hiatal, incisional   IRIDOTOMY / IRIDECTOMY     ROBOTIC ASSISTED LAPAROSCOPIC REPAIR OF PARAESOPHAGEAL HERNIA N/A 12/18/2017   Procedure: ATTEMPTED ROBOTIC ASSISTED LAPAROSCOPIC REPAIR OF PARAESOPHAGEAL HERNIA;  Surgeon: Jordis Laneta FALCON, MD;  Location: [facility];  Service: General;  Laterality: N/A;   TONSILLECTOMY     age 84   VENTRAL HERNIA REPAIR  12/18/2017   Procedure: HERNIA REPAIR VENTRAL ADULT;  Surgeon: Jordis Laneta FALCON, MD;  Location: [facility];  Service: General;;    OB History   No obstetric history on file.      Home Medications    Prior to Admission medications   Medication Sig Start Date End Date Taking? Authorizing Provider  azithromycin  (ZITHROMAX ) 250 MG tablet Take 1 tablet (250 mg total) by mouth daily. Take first 2 tablets together, then 1 every day  until finished. 08/18/23  Yes Timaya Bojarski  N, FNP  promethazine -dextromethorphan (PROMETHAZINE -DM) 6.25-15 MG/5ML syrup Take 2.5 mLs by mouth 4 (four) times daily as needed for cough. 08/18/23  Yes Rutilio Yellowhair  N, FNP  amLODipine  (NORVASC ) 5 MG tablet Take 5 mg by mouth daily.    [provider]  aspirin EC 81 MG tablet Take 81 mg by mouth daily.    [provider]  cetirizine  (ZYRTEC ) 10 MG tablet Take 1 tablet (10 mg total) by mouth daily. 06/02/22   Hazen Darryle BRAVO, FNP  Cyanocobalamin (B-12) 1000 MCG CAPS Take 1,000 mcg by mouth daily.      [provider]  doxycycline  (VIBRAMYCIN ) 100 MG capsule Take 1 capsule (100 mg total) by mouth 2 (two) times daily. 06/02/22   Hazen Darryle BRAVO, FNP  famotidine  (PEPCID ) 20 MG tablet Take 1 tablet (20 mg total) by mouth 2 (two) times daily. 06/02/22   Hazen Darryle BRAVO, FNP  metoprolol  succinate (TOPROL -XL) 25 MG 24 hr tablet Take 25 mg by mouth at bedtime.  08/31/16   [provider]  Multiple Vitamin (MULTI-VITAMINS) TABS Take 1 tablet by mouth 3 (three) times a week.     [provider]  Multiple Vitamins-Minerals (ICAPS AREDS 2 PO) Take 1 tablet by mouth 2 (two) times daily.     [provider]  Omega-3 Fatty Acids (FISH OIL) 1200 MG CAPS Take 1,200 mg by mouth daily.     [provider]  omeprazole  (PRILOSEC) 40 MG capsule Take 1 capsule (40 mg total) by mouth daily. 12/28/17   Terryl Arthea SAUNDERS, PA-C    Family History Family History  Problem Relation Age of Onset   COPD Mother    Cancer Mother        not sure what type   Hypertension Mother    COPD Father    Heart attack Brother    High blood pressure Brother    Breast cancer Neg Hx     Social History Social History   Tobacco Use   Smoking status: Never   Smokeless tobacco: Never  Vaping Use   Vaping status: Never Used  Substance Use Topics   Alcohol use: Never   Drug use: Never     Allergies   Patient has no known allergies.   Review of Systems Review of Systems   Physical Exam Triage Vital Signs ED Triage Vitals  Encounter Vitals Group     BP 08/18/23 1015 134/83     Girls Systolic BP Percentile --      Girls Diastolic BP Percentile --      Boys Systolic BP Percentile --      Boys Diastolic BP Percentile --      Pulse Rate 08/18/23 1015 82     Resp 08/18/23 1015 17     Temp 08/18/23 1015 98.2 F (36.8 C)     Temp Source 08/18/23 1015 Oral     SpO2 08/18/23 1015 95 %     Weight --      Height --      Head Circumference --      Peak Flow --      Pain Score  08/18/23 1018 0     Pain Loc --      Pain Education --      Exclude from Growth Chart --    No data found.  Updated Vital Signs BP 134/83 (BP Location: Right Arm)   Pulse 82   Temp 98.2 F (  36.8 C) (Oral)   Resp 17   SpO2 95%   Visual Acuity Right Eye Distance:   Left Eye Distance:   Bilateral Distance:    Right Eye Near:   Left Eye Near:    Bilateral Near:     Physical Exam Vitals and nursing note reviewed.  Constitutional:      Appearance: Normal appearance.  HENT:     Head: Normocephalic and atraumatic.     Right Ear: External ear normal.     Left Ear: External ear normal.     Nose: Congestion and rhinorrhea present.     Mouth/Throat:     Mouth: Mucous membranes are moist.     Pharynx: Posterior oropharyngeal erythema present.  Eyes:     Conjunctiva/sclera: Conjunctivae normal.  Cardiovascular:     Rate and Rhythm: Normal rate and regular rhythm.     Heart sounds: Normal heart sounds.  Pulmonary:     Effort: Pulmonary effort is normal. No respiratory distress.     Breath sounds: Normal breath sounds.  Skin:    General: Skin is warm and dry.  Neurological:     General: No focal deficit present.     Mental Status: She is alert.  Psychiatric:        Mood and Affect: Mood normal.      UC Treatments / Results  Labs (all labs ordered are listed, but only abnormal results are displayed) Labs Reviewed - No data to display  EKG   Radiology No results found.  Procedures Procedures (including critical care time)  Medications Ordered in UC Medications - No data to display  Initial Impression / Assessment and Plan / UC Course  I have reviewed the triage vital signs and the nursing notes.  Pertinent labs & imaging results that were available during my care of the patient were reviewed by me and considered in my medical decision making (see chart for details).  Vitals in triage reviewed, patient is hemodynamically stable.  Lungs vesicular, heart with  regular rate and rhythm.  Congestion, rhinorrhea and postnasal drip present as well as sinus pressure.  Will treat as acute bronchitis, will cover with azithromycin , as this has helped patient in the past.  Cough management discussed.  Plan of care, follow-up care return precautions given, no questions at this time.     Final Clinical Impressions(s) / UC Diagnoses   Final diagnoses:  Acute bronchitis, unspecified organism     Discharge Instructions      Take the antibiotics as prescribed.  You can take them with food to help prevent stomach upset.  You can use the cough syrup 4 times daily as needed for cough.  Ensure you are staying well-hydrated with at least 64 ounces of water daily to help loosen secretions and that you are staying active throughout the day.  1200 mg of Mucinex may help as well.  Symptoms should improve over the next few days, if no improvement or any changes follow-up with your primary care provider or return to clinic for reevaluation.   ED Prescriptions     Medication Sig Dispense Auth. Provider   azithromycin  (ZITHROMAX ) 250 MG tablet Take 1 tablet (250 mg total) by mouth daily. Take first 2 tablets together, then 1 every day until finished. 6 tablet Dreama, Anitta Tenny  N, FNP   promethazine -dextromethorphan (PROMETHAZINE -DM) 6.25-15 MG/5ML syrup Take 2.5 mLs by mouth 4 (four) times daily as needed for cough. 118 mL Dreama, Geselle Cardosa  N, FNP      PDMP  not reviewed this encounter.   Dreama Lorane SAILOR, FNP 08/18/23 1052

## 2023-08-18 NOTE — Discharge Instructions (Signed)
 Take the antibiotics as prescribed.  You can take them with food to help prevent stomach upset.  You can use the cough syrup 4 times daily as needed for cough.  Ensure you are staying well-hydrated with at least 64 ounces of water daily to help loosen secretions and that you are staying active throughout the day.  1200 mg of Mucinex may help as well.  Symptoms should improve over the next few days, if no improvement or any changes follow-up with your primary care provider or return to clinic for reevaluation.

## 2023-09-02 ENCOUNTER — Ambulatory Visit
Admission: RE | Admit: 2023-09-02 | Discharge: 2023-09-02 | Disposition: A | Discharge status: Home / Self Care | Source: Ambulatory Visit | Attending: Family Medicine | Admitting: Family Medicine

## 2023-09-02 VITALS — BP 150/92 | HR 75 | Temp 98.8°F | Resp 16

## 2023-09-02 DIAGNOSIS — R300 Dysuria: Secondary | ICD-10-CM | POA: Diagnosis present

## 2023-09-02 DIAGNOSIS — B9620 Unspecified Escherichia coli [E. coli] as the cause of diseases classified elsewhere: Secondary | ICD-10-CM | POA: Insufficient documentation

## 2023-09-02 DIAGNOSIS — R350 Frequency of micturition: Secondary | ICD-10-CM | POA: Diagnosis present

## 2023-09-02 DIAGNOSIS — N3001 Acute cystitis with hematuria: Secondary | ICD-10-CM | POA: Insufficient documentation

## 2023-09-02 LAB — POCT URINALYSIS DIP (MANUAL ENTRY)
Bilirubin, UA: NEGATIVE
Glucose, UA: NEGATIVE mg/dL
Ketones, POC UA: NEGATIVE mg/dL
Nitrite, UA: NEGATIVE
Protein Ur, POC: 100 mg/dL — AB
Spec Grav, UA: 1.02 (ref 1.010–1.025)
Urobilinogen, UA: 1 U/dL
pH, UA: 6 (ref 5.0–8.0)

## 2023-09-02 MED ORDER — PHENAZOPYRIDINE HCL 200 MG PO TABS
200.0000 mg | ORAL_TABLET | ORAL | 0 refills | Status: AC
Start: 2023-09-02 — End: 2023-09-04

## 2023-09-02 MED ORDER — NITROFURANTOIN MONOHYD MACRO 100 MG PO CAPS: 100.0000 mg | ORAL_CAPSULE | Freq: Two times a day (BID) | ORAL | 0 refills | Status: DC

## 2023-09-02 NOTE — ED Triage Notes (Signed)
 Pt c/o urinary frequency, urgency, burning with urination, and right side flank pain since Tuesday.

## 2023-09-02 NOTE — Discharge Instructions (Addendum)
 You were seen today for symptoms consistent with a urinary tract infection (UTI). You have been prescribed Macrobid  to treat the infection and Pyridium  to help relieve discomfort such as burning, urgency, and bladder pressure. Take the antibiotics exactly as prescribed and complete the full course, even if you start feeling better. Pyridium  may cause your urine to change color, which is a normal side effect of the medication. A urine culture has been sent to identify the specific bacteria causing the infection and to confirm that the prescribed antibiotic is appropriate. You will only be contacted if your results are abnormal; otherwise, you may review them in your MyChart account.   It is important to stay well hydrated by drinking plenty of fluids throughout the day. This helps flush out your urinary system and keeps your urine light yellow, which is a sign of good hydration. Avoid caffeine and alcohol, as they can irritate the bladder. Be sure to urinate regularly and empty your bladder fully. Do not hold your urine for extended periods. Always wipe from front to back after using the bathroom and use a clean tissue for each wipe. It is also important to urinate after sexual activity. Avoid douching or using sprays or powders in the genital area, as these can cause irritation. Follow up with your healthcare provider if your symptoms do not improve within a few days, get worse, or return after completing your treatment.

## 2023-09-02 NOTE — ED Provider Notes (Signed)
 [facility]    CSN: [id number] Arrival date & time: 09/02/23  1407      History   Chief Complaint Chief Complaint  Patient presents with   Urinary Frequency    Possible UTI or Kidney Infection - Entered by patient    HPI Briana Hines is a 68 y.o. female.   Discussed the use of AI scribe software for clinical note transcription with the patient, who gave verbal consent to proceed.   Patient presents with symptoms of a urinary tract infection that started on Tuesday. She reports experiencing urgency to urinate and a burning sensation during urination. The patient states that she has been experiencing back pain associated with these urinary symptoms. To manage the pain, she has been taking Tylenol  throughout the week. She denies any nausea, vomiting, or fevers. She mentions that she has been drinking plenty of fluids to help alleviate her symptoms.  The following portions of the patient's history were reviewed and updated as appropriate: allergies, current medications, past family history, past medical history, past social history, past surgical history, and problem list.      Past Medical History:  Diagnosis Date   Complication of anesthesia    CRVO (central retinal vein occlusion)    GERD (gastroesophageal reflux disease)    Glaucoma    History of hiatal hernia    Hypertension    Macular degeneration    PONV (postoperative nausea and vomiting)    dry heaves in 2011 only    Patient Active Problem List   Diagnosis Date Noted   Nonexudative age-related macular degeneration, bilateral, early dry stage 12/06/2020   Stable central retinal vein occlusion of right eye 12/06/2020   Exudative age-related macular degeneration of right eye with active choroidal neovascularization (HCC) 12/06/2020   Intermediate stage nonexudative age-related macular degeneration of both eyes 12/06/2020   PONV (postoperative nausea and vomiting) 12/24/2017   S/P repair of  paraesophageal hernia 12/18/2017   Hiatal hernia with gastroesophageal reflux    CRVO (central retinal vein occlusion) 10/08/2017   Hiatal hernia 05/20/2013   Anemia, unspecified 05/20/2013   History of pneumonia 05/20/2013    Past Surgical History:  Procedure Laterality Date   BREAST BIOPSY Left 1990's   benign   CATARACT EXTRACTION Bilateral    CATARACT EXTRACTION, BILATERAL  09/2017   COLONOSCOPY WITH PROPOFOL  N/A 05/16/2021   Procedure: COLONOSCOPY WITH PROPOFOL ;  Surgeon: Maryruth Ole DASEN, MD;  Location: [facility];  Service: Endoscopy;  Laterality: N/A;   HERNIA REPAIR  2010   hiatal, incisional   IRIDOTOMY / IRIDECTOMY     ROBOTIC ASSISTED LAPAROSCOPIC REPAIR OF PARAESOPHAGEAL HERNIA N/A 12/18/2017   Procedure: ATTEMPTED ROBOTIC ASSISTED LAPAROSCOPIC REPAIR OF PARAESOPHAGEAL HERNIA;  Surgeon: Jordis Laneta FALCON, MD;  Location: [facility];  Service: General;  Laterality: N/A;   TONSILLECTOMY     age 22   VENTRAL HERNIA REPAIR  12/18/2017   Procedure: HERNIA REPAIR VENTRAL ADULT;  Surgeon: Jordis Laneta FALCON, MD;  Location: [facility];  Service: General;;    OB History   No obstetric history on file.      Home Medications    Prior to Admission medications   Medication Sig Start Date End Date Taking? Authorizing Provider  nitrofurantoin , macrocrystal-monohydrate, (MACROBID ) 100 MG capsule Take 1 capsule (100 mg total) by mouth 2 (two) times daily. 09/02/23  Yes Livianna Petraglia, FNP  phenazopyridine  (PYRIDIUM ) 200 MG tablet Take 1 tablet (200 mg total) by mouth 3 (three) times daily at 8am,  3pm and bedtime for 2 days. 09/02/23 09/04/23 Yes Iola Lukes, FNP  amLODipine  (NORVASC ) 5 MG tablet Take 5 mg by mouth daily.    [provider]  aspirin EC 81 MG tablet Take 81 mg by mouth daily.    [provider]  cetirizine  (ZYRTEC ) 10 MG tablet Take 1 tablet (10 mg total) by mouth daily. 06/02/22   Hazen Darryle BRAVO, FNP  Cyanocobalamin (B-12) 1000 MCG CAPS Take 1,000  mcg by mouth daily.     [provider]  famotidine  (PEPCID ) 20 MG tablet Take 1 tablet (20 mg total) by mouth 2 (two) times daily. 06/02/22   Hazen Darryle BRAVO, FNP  metoprolol  succinate (TOPROL -XL) 25 MG 24 hr tablet Take 25 mg by mouth at bedtime.  08/31/16   [provider]  Multiple Vitamin (MULTI-VITAMINS) TABS Take 1 tablet by mouth 3 (three) times a week.     [provider]  Multiple Vitamins-Minerals (ICAPS AREDS 2 PO) Take 1 tablet by mouth 2 (two) times daily.     [provider]  Omega-3 Fatty Acids (FISH OIL) 1200 MG CAPS Take 1,200 mg by mouth daily.     [provider]  omeprazole  (PRILOSEC) 40 MG capsule Take 1 capsule (40 mg total) by mouth daily. 12/28/17   Schulz, Zachary R, PA-C  promethazine -dextromethorphan (PROMETHAZINE -DM) 6.25-15 MG/5ML syrup Take 2.5 mLs by mouth 4 (four) times daily as needed for cough. Patient not taking: Reported on 09/02/2023 08/18/23   Dreama, [place]  N, FNP    Family History Family History  Problem Relation Age of Onset   COPD Mother    Cancer Mother        not sure what type   Hypertension Mother    COPD Father    Heart attack Brother    High blood pressure Brother    Breast cancer Neg Hx     Social History Social History   Tobacco Use   Smoking status: Never   Smokeless tobacco: Never  Vaping Use   Vaping status: Never Used  Substance Use Topics   Alcohol use: Never   Drug use: Never     Allergies   Patient has no known allergies.   Review of Systems Review of Systems  Constitutional:  Negative for fever.  Gastrointestinal:  Negative for nausea and vomiting.  Genitourinary:  Positive for dysuria, frequency and urgency.  Musculoskeletal:  Positive for back pain (right lower).  All other systems reviewed and are negative.    Physical Exam Triage Vital Signs ED Triage Vitals  Encounter Vitals Group     BP 09/02/23 1415 (!) 150/92     Girls Systolic BP Percentile --       Girls Diastolic BP Percentile --      Boys Systolic BP Percentile --      Boys Diastolic BP Percentile --      Pulse Rate 09/02/23 1415 75     Resp 09/02/23 1415 16     Temp 09/02/23 1415 98.8 F (37.1 C)     Temp Source 09/02/23 1415 Oral     SpO2 09/02/23 1415 95 %     Weight --      Height --      Head Circumference --      Peak Flow --      Pain Score 09/02/23 1414 0     Pain Loc --      Pain Education --      Exclude from Growth Chart --  No data found.  Updated Vital Signs BP (!) 150/92 (BP Location: Right Arm)   Pulse 75   Temp 98.8 F (37.1 C) (Oral)   Resp 16   SpO2 95%   Visual Acuity Right Eye Distance:   Left Eye Distance:   Bilateral Distance:    Right Eye Near:   Left Eye Near:    Bilateral Near:     Physical Exam Vitals reviewed.  Constitutional:      General: She is awake. She is not in acute distress.    Appearance: Normal appearance. She is well-developed. She is not ill-appearing, toxic-appearing or diaphoretic.  HENT:     Head: Normocephalic.     Right Ear: Hearing normal.     Left Ear: Hearing normal.     Nose: Nose normal.     Mouth/Throat:     Mouth: Mucous membranes are moist.  Eyes:     General: Vision grossly intact.     Conjunctiva/sclera: Conjunctivae normal.  Cardiovascular:     Rate and Rhythm: Normal rate and regular rhythm.     Heart sounds: Normal heart sounds.  Pulmonary:     Effort: Pulmonary effort is normal.     Breath sounds: Normal breath sounds and air entry.  Musculoskeletal:        General: Normal range of motion.     Cervical back: Full passive range of motion without pain, normal range of motion and neck supple.  Skin:    General: Skin is warm and dry.  Neurological:     General: No focal deficit present.     Mental Status: She is alert and oriented to person, place, and time.  Psychiatric:        Speech: Speech normal.        Behavior: Behavior is cooperative.      UC Treatments / Results   Labs (all labs ordered are listed, but only abnormal results are displayed) Labs Reviewed  POCT URINALYSIS DIP (MANUAL ENTRY) - Abnormal; Notable for the following components:      Result Value   Clarity, UA cloudy (*)    Blood, UA moderate (*)    Protein Ur, POC =100 (*)    Leukocytes, UA Large (3+) (*)    All other components within normal limits  URINE CULTURE    EKG   Radiology No results found.  Procedures Procedures (including critical care time)  Medications Ordered in UC Medications - No data to display  Initial Impression / Assessment and Plan / UC Course  I have reviewed the triage vital signs and the nursing notes.  Pertinent labs & imaging results that were available during my care of the patient were reviewed by me and considered in my medical decision making (see chart for details).    Patient presents with symptoms of low back pain, suprapubic pressure, urinary urgency, frequency, and dysuria since Tuesday. Urinalysis shows cloudy urine with microscopic hematuria and a large number of leukocytes, consistent with a urinary tract infection. There are no associated symptoms of nausea, vomiting, or fever. Macrobid  was prescribed for 5 days, and Pyridium  was given for symptomatic relief over the next 2 days. A urine sample was sent for culture and sensitivity to confirm the pathogen and guide further treatment if needed. Patient was advised to increase fluid intake and monitor symptoms. She was instructed to check MyChart for culture results and to follow up with her primary care provider or return to the clinic if symptoms worsen, do not  improve, or if new symptoms such as fever, flank pain, or vomiting develop. Emergency evaluation is advised for signs of systemic illness or urinary obstruction.  Today's evaluation has revealed no signs of a dangerous process. Discussed diagnosis with patient and/or guardian. Patient and/or guardian aware of their diagnosis, possible  red flag symptoms to watch out for and need for close follow up. Patient and/or guardian understands verbal and written discharge instructions. Patient and/or guardian comfortable with plan and disposition.  Patient and/or guardian has a clear mental status at this time, good insight into illness (after discussion and teaching) and has clear judgment to make decisions regarding their care  Documentation was completed with the aid of voice recognition software. Transcription may contain typographical errors. Final Clinical Impressions(s) / UC Diagnoses   Final diagnoses:  Acute cystitis with hematuria  Dysuria     Discharge Instructions      You were seen today for symptoms consistent with a urinary tract infection (UTI). You have been prescribed Macrobid  to treat the infection and Pyridium  to help relieve discomfort such as burning, urgency, and bladder pressure. Take the antibiotics exactly as prescribed and complete the full course, even if you start feeling better. Pyridium   may cause your urine to change color, which is a normal side effect of the medication. A urine culture has been sent to identify the specific bacteria causing the infection and to confirm that the prescribed antibiotic is appropriate. You will only be contacted if your results are abnormal; otherwise, you may review them in your MyChart account.   It is important to stay well hydrated by drinking plenty of fluids throughout the day. This helps flush out your urinary system and keeps your urine light yellow, which is a sign of good hydration. Avoid caffeine and alcohol, as they can irritate the bladder. Be sure to urinate regularly and empty your bladder fully. Do not hold your urine for extended periods. Always wipe from front to back after using the bathroom and use a clean tissue for each wipe. It is also important to urinate after sexual activity. Avoid douching or using sprays or powders in the genital area, as these can  cause irritation. Follow up with your healthcare provider if your symptoms do not improve within a few days, get worse, or return after completing your treatment.        ED Prescriptions     Medication Sig Dispense Auth. Provider   phenazopyridine  (PYRIDIUM ) 200 MG tablet Take 1 tablet (200 mg total) by mouth 3 (three) times daily at 8am, 3pm and bedtime for 2 days. 6 tablet Onyx Schirmer, [place], FNP   nitrofurantoin , macrocrystal-monohydrate, (MACROBID ) 100 MG capsule Take 1 capsule (100 mg total) by mouth 2 (two) times daily. 10 capsule Iola Lukes, FNP      PDMP not reviewed this encounter.   Iola Lukes, [place] 09/02/23 [phone]

## 2023-09-04 ENCOUNTER — Ambulatory Visit (HOSPITAL_COMMUNITY): Payer: Self-pay

## 2023-09-04 LAB — URINE CULTURE: Culture: 50000 — AB

## 2023-10-05 ENCOUNTER — Ambulatory Visit (INDEPENDENT_AMBULATORY_CARE_PROVIDER_SITE_OTHER): Admitting: Radiology

## 2023-10-05 ENCOUNTER — Ambulatory Visit
Admission: RE | Admit: 2023-10-05 | Discharge: 2023-10-05 | Disposition: A | Discharge status: Home / Self Care | Attending: Physician Assistant | Admitting: Physician Assistant

## 2023-10-05 ENCOUNTER — Other Ambulatory Visit: Payer: Self-pay

## 2023-10-05 VITALS — BP 119/75 | HR 90 | Temp 100.7°F | Resp 20 | Ht 67.0 in

## 2023-10-05 DIAGNOSIS — R0689 Other abnormalities of breathing: Secondary | ICD-10-CM

## 2023-10-05 DIAGNOSIS — R051 Acute cough: Secondary | ICD-10-CM | POA: Diagnosis not present

## 2023-10-05 DIAGNOSIS — J189 Pneumonia, unspecified organism: Secondary | ICD-10-CM | POA: Diagnosis not present

## 2023-10-05 LAB — POC COVID19/FLU A&B COMBO
Covid Antigen, POC: NEGATIVE
Influenza A Antigen, POC: NEGATIVE
Influenza B Antigen, POC: NEGATIVE

## 2023-10-05 MED ORDER — AMOXICILLIN-POT CLAVULANATE 875-125 MG PO TABS: 1.0000 | ORAL_TABLET | Freq: Two times a day (BID) | ORAL | 0 refills | Status: AC

## 2023-10-05 MED ORDER — ALBUTEROL SULFATE HFA 108 (90 BASE) MCG/ACT IN AERS: 1.0000 | INHALATION_SPRAY | Freq: Four times a day (QID) | RESPIRATORY_TRACT | 0 refills | Status: AC | PRN

## 2023-10-05 MED ORDER — AZITHROMYCIN 250 MG PO TABS: ORAL_TABLET | ORAL | 0 refills | Status: AC

## 2023-10-05 MED ORDER — ACETAMINOPHEN 325 MG PO TABS: 650.0000 mg | ORAL_TABLET | Freq: Once | ORAL | Status: DC

## 2023-10-05 NOTE — Discharge Instructions (Addendum)
 You were seen today for concerns of pneumonia.  At this time your chest x-ray appears consistent with pneumonia so I am starting you on 2 antibiotics as well as an inhaler to assist with your symptoms.  I have sent in a prescription for 2 antibiotics for you to take.  One is called Augmentin  for you to take by mouth twice per day for 7 days.  The other is azithromycin , also known as a Z-Pak, for you to take by mouth per the instructions on the container.   I am also sending in an inhaler for you to use as needed up to every 6 hours for shortness of breath and coughing. In addition to these medications you can also take over-the-counter medications such as Robitussin, Mucinex, Tylenol  and ibuprofen as needed for symptom management.  If you feel your symptoms are not improving or seeming worsening I recommend follow-up with your primary care provider or going to the ED if you are experiencing significant trouble breathing, chest pain, fever that is not responding to tylenol  or ibuprofen, coughing up blood, confusion, loss of consciousness.

## 2023-10-05 NOTE — ED Provider Notes (Signed)
 [facility]    CSN: [id number] Arrival date & time: 10/05/23  1703      History   Chief Complaint Chief Complaint  Patient presents with   Fever    Running fever for 3 days feel bad all over coughing bad headache - Entered by patient    HPI Briana Hines is a 68 y.o. female.  has a past medical history of Complication of anesthesia, CRVO (central retinal vein occlusion), GERD (gastroesophageal reflux disease), Glaucoma, History of hiatal hernia, Hypertension, Macular degeneration, and PONV (postoperative nausea and vomiting).   HPI  Pt is here with concerns for fever, chills, headaches, coughing- she reports there is clear sputum production  She reports symptoms have been ongoing since Wed and do not seem to be improving with home measures She also reports rhinorrhea, malaise  She states she did have some diarrhea yesterday for one bowel movement but this was limited  Interventions: Tylenol  every 4 hours while awake and rx cough syrup (promethazine  -dM) at nighttime  She reports she lives alone and denies recent sick contacts or travel   She denies previous hx of asthma or COPD    Past Medical History:  Diagnosis Date   Complication of anesthesia    CRVO (central retinal vein occlusion)    GERD (gastroesophageal reflux disease)    Glaucoma    History of hiatal hernia    Hypertension    Macular degeneration    PONV (postoperative nausea and vomiting)    dry heaves in 2011 only    Patient Active Problem List   Diagnosis Date Noted   Nonexudative age-related macular degeneration, bilateral, early dry stage 12/06/2020   Stable central retinal vein occlusion of right eye 12/06/2020   Exudative age-related macular degeneration of right eye with active choroidal neovascularization (HCC) 12/06/2020   Intermediate stage nonexudative age-related macular degeneration of both eyes 12/06/2020   PONV (postoperative nausea and vomiting) 12/24/2017   S/P repair of  paraesophageal hernia 12/18/2017   Hiatal hernia with gastroesophageal reflux    CRVO (central retinal vein occlusion) 10/08/2017   Hiatal hernia 05/20/2013   Anemia, unspecified 05/20/2013   History of pneumonia 05/20/2013    Past Surgical History:  Procedure Laterality Date   BREAST BIOPSY Left 1990's   benign   CATARACT EXTRACTION Bilateral    CATARACT EXTRACTION, BILATERAL  09/2017   COLONOSCOPY WITH PROPOFOL  N/A 05/16/2021   Procedure: COLONOSCOPY WITH PROPOFOL ;  Surgeon: Maryruth Ole DASEN, MD;  Location: [facility];  Service: Endoscopy;  Laterality: N/A;   HERNIA REPAIR  2010   hiatal, incisional   IRIDOTOMY / IRIDECTOMY     ROBOTIC ASSISTED LAPAROSCOPIC REPAIR OF PARAESOPHAGEAL HERNIA N/A 12/18/2017   Procedure: ATTEMPTED ROBOTIC ASSISTED LAPAROSCOPIC REPAIR OF PARAESOPHAGEAL HERNIA;  Surgeon: Jordis Laneta FALCON, MD;  Location: [facility];  Service: General;  Laterality: N/A;   TONSILLECTOMY     age 61   VENTRAL HERNIA REPAIR  12/18/2017   Procedure: HERNIA REPAIR VENTRAL ADULT;  Surgeon: Jordis Laneta FALCON, MD;  Location: [facility] OR[facility];  Service: General;;    OB History   No obstetric history on file.      Home Medications    Prior to Admission medications   Medication Sig Start Date End Date Taking? Authorizing Provider  albuterol  (VENTOLIN  HFA) 108 (90 Base) MCG/ACT inhaler Inhale 1-2 puffs into the lungs every 6 (six) hours as needed for wheezing or shortness of breath. 10/05/23  Yes Josyah Achor E, PA-C  amoxicillin -clavulanate (AUGMENTIN )  875-125 MG tablet Take 1 tablet by mouth every 12 (twelve) hours. 10/05/23  Yes Destiny Trickey E, PA-C  azithromycin  (ZITHROMAX ) 250 MG tablet Take 2 tablets (500 mg total) by mouth daily for 1 day, THEN 1 tablet (250 mg total) daily for 4 days. 10/05/23 10/10/23 Yes Sharmin Foulk E, PA-C  amLODipine  (NORVASC ) 5 MG tablet Take 5 mg by mouth daily.    [provider]  aspirin EC 81 MG tablet Take 81 mg by mouth daily.    [provider]  cetirizine  (ZYRTEC ) 10 MG tablet Take 1 tablet (10 mg total) by mouth daily. 06/02/22   Hazen Darryle BRAVO, FNP  Cyanocobalamin (B-12) 1000 MCG CAPS Take 1,000 mcg by mouth daily.     [provider]  famotidine  (PEPCID ) 20 MG tablet Take 1 tablet (20 mg total) by mouth 2 (two) times daily. 06/02/22   Hazen Darryle BRAVO, FNP  metoprolol  succinate (TOPROL -XL) 25 MG 24 hr tablet Take 25 mg by mouth at bedtime.  08/31/16   [provider]  Multiple Vitamin (MULTI-VITAMINS) TABS Take 1 tablet by mouth 3 (three) times a week.     [provider]  Multiple Vitamins-Minerals (ICAPS AREDS 2 PO) Take 1 tablet by mouth 2 (two) times daily.     [provider]  Omega-3 Fatty Acids (FISH OIL) 1200 MG CAPS Take 1,200 mg by mouth daily.     [provider]  omeprazole  (PRILOSEC) 40 MG capsule Take 1 capsule (40 mg total) by mouth daily. 12/28/17   Schulz, Zachary R, PA-C  promethazine -dextromethorphan (PROMETHAZINE -DM) 6.25-15 MG/5ML syrup Take 2.5 mLs by mouth 4 (four) times daily as needed for cough. Patient not taking: Reported on 09/02/2023 08/18/23   Dreama Arland SAILOR, FNP    Family History Family History  Problem Relation Age of Onset   COPD Mother    Cancer Mother        not sure what type   Hypertension Mother    COPD Father    Heart attack Brother    High blood pressure Brother    Breast cancer Neg Hx     Social History Social History   Tobacco Use   Smoking status: Never   Smokeless tobacco: Never  Vaping Use   Vaping status: Never Used  Substance Use Topics   Alcohol use: Never   Drug use: Never     Allergies   Patient has no known allergies.   Review of Systems Review of Systems  Constitutional:  Positive for appetite change, chills, fatigue and fever.  HENT:  Positive for rhinorrhea. Negative for congestion, sinus pressure, sinus pain and sore throat.   Respiratory:  Positive for cough and shortness of breath. Negative for wheezing.    Gastrointestinal:  Negative for diarrhea, nausea and vomiting.  Musculoskeletal:  Negative for myalgias.  Neurological:  Positive for headaches.     Physical Exam Triage Vital Signs ED Triage Vitals  Encounter Vitals Group     BP 10/05/23 1720 119/75     Girls Systolic BP Percentile --      Girls Diastolic BP Percentile --      Boys Systolic BP Percentile --      Boys Diastolic BP Percentile --      Pulse Rate 10/05/23 1720 88     Resp 10/05/23 1720 20     Temp 10/05/23 1720 (!) 100.7 F (38.2 C)     Temp Source 10/05/23 1720 Oral     SpO2 10/05/23  1720 92 %     Weight --      Height 10/05/23 1720 5' 7 (1.702 m)     Head Circumference --      Peak Flow --      Pain Score 10/05/23 1802 8     Pain Loc --      Pain Education --      Exclude from Growth Chart --    No data found.  Updated Vital Signs BP 119/75 (BP Location: Right Arm)   Pulse 90   Temp (!) 100.7 F (38.2 C) (Oral) Comment: @1530  took  2 Tylenol   Resp 20   Ht 5' 7 (1.702 m)   SpO2 96%   BMI 30.54 kg/m   Visual Acuity Right Eye Distance:   Left Eye Distance:   Bilateral Distance:    Right Eye Near:   Left Eye Near:    Bilateral Near:     Physical Exam Vitals reviewed.  Constitutional:      General: She is awake.     Appearance: Normal appearance. She is well-developed and well-groomed.  HENT:     Head: Normocephalic and atraumatic.     Right Ear: Hearing, tympanic membrane and ear canal normal.     Left Ear: Hearing, tympanic membrane and ear canal normal.     Mouth/Throat:     Lips: Pink.     Mouth: Mucous membranes are moist.     Pharynx: Oropharynx is clear. Uvula midline. No pharyngeal swelling, oropharyngeal exudate, posterior oropharyngeal erythema, uvula swelling or postnasal drip.  Cardiovascular:     Rate and Rhythm: Normal rate and regular rhythm.     Pulses: Normal pulses.          Radial pulses are 2+ on the right side and 2+ on the left side.     Heart sounds: Normal  heart sounds. No murmur heard.    No friction rub. No gallop.  Pulmonary:     Effort: Pulmonary effort is normal.     Breath sounds: Decreased air movement present. Examination of the left-upper field reveals decreased breath sounds. Examination of the left-middle field reveals decreased breath sounds. Examination of the left-lower field reveals decreased breath sounds. Decreased breath sounds present. No wheezing, rhonchi or rales.     Comments: Chest sounds tight on the left side compared to the right  Musculoskeletal:     Cervical back: Normal range of motion and neck supple.  Lymphadenopathy:     Head:     Right side of head: No submental, submandibular or preauricular adenopathy.     Left side of head: No submental, submandibular or preauricular adenopathy.     Cervical:     Right cervical: No superficial cervical adenopathy.    Left cervical: No superficial cervical adenopathy.     Upper Body:     Right upper body: No supraclavicular adenopathy.     Left upper body: No supraclavicular adenopathy.  Neurological:     Mental Status: She is alert.  Psychiatric:        Behavior: Behavior is cooperative.      UC Treatments / Results  Labs (all labs ordered are listed, but only abnormal results are displayed) Labs Reviewed  POC COVID19/FLU A&B COMBO    EKG   Radiology DG Chest 2 View Result Date: 10/05/2023 CLINICAL DATA:  Decreased breath sounds on the left side. EXAM: CHEST - 2 VIEW COMPARISON:  November 20, 2020 FINDINGS: The heart size and mediastinal contours are  within normal limits. Moderate to marked severity atelectasis and/or infiltrate is seen within the left lung base. A small left pleural effusion is also noted. There is a stable, small to moderate sized hiatal hernia. The visualized skeletal structures are unremarkable. IMPRESSION: 1. Moderate to marked severity left basilar atelectasis and/or infiltrate. 2. Small left pleural effusion. 3. Stable, small to moderate  sized hiatal hernia. Electronically Signed   By: Suzen Dials M.D.   On: 10/05/2023 19:31    Procedures Procedures (including critical care time)  Medications Ordered in UC Medications - No data to display  Initial Impression / Assessment and Plan / UC Course  I have reviewed the triage vital signs and the nursing notes.  Pertinent labs & imaging results that were available during my care  of the patient were reviewed by me and considered in my medical decision making (see chart for details).    Chest xray appears concerning for left lower lobe consolidation    Final Clinical Impressions(s) / UC Diagnoses   Final diagnoses:  Acute cough  Decreased breath sounds in left mid-lung field  Community acquired pneumonia of left lower lobe of lung   Patient presents today with concerns for persistent coughing, fatigue, runny nose and appetite change has been ongoing since Wednesday.  She reports minimal improvement with home measures.  Physical exam is notable for decreased breath sounds on the left side compared to the right as well as decreased air movement.  Vitals are notable for variability between 90% oxygen saturation and 96%.  My initial interpretation of chest x-ray was concerning for left lower lobe consolidation.  Radiology interpretation demonstrates moderate to marked severity left basilar atelectasis and/or infiltrate as well as small left pleural effusion.  Patient was sent home with community-acquired pneumonia protocol comprised of Augmentin  p.o. twice daily x 7 days as well as azithromycin .  Patient reports that she has a bad reaction to prednisone so we will do albuterol  inhaler to assist with shortness of breath.  Recommend continued use of over-the-counter medications as needed for further symptomatic relief.  ED and return precautions reviewed and provided in after visit summary.  Follow-up as needed.    Discharge Instructions      You were seen today for concerns  of pneumonia.  At this time your chest x-ray appears consistent with pneumonia so I am starting you on 2 antibiotics as well as an inhaler to assist with your symptoms.  I have sent in a prescription for 2 antibiotics for you to take.  One is called Augmentin  for you to take by mouth twice per day for 7 days.  The other is azithromycin , also known as a Z-Pak, for you to take by mouth per the instructions on the container.   I am also sending in an inhaler for you to use as needed up to every 6 hours for shortness of breath and coughing. In addition to these medications you can also take over-the-counter medications such as Robitussin, Mucinex, Tylenol  and ibuprofen as needed for symptom management.  If you feel your symptoms are not improving or seeming worsening I recommend follow-up with your primary care provider or going to the ED if you are experiencing significant trouble breathing, chest pain, fever that is not responding to tylenol  or ibuprofen, coughing up blood, confusion, loss of consciousness.       ED Prescriptions     Medication Sig Dispense Auth. Provider   albuterol  (VENTOLIN  HFA) 108 (90 Base) MCG/ACT inhaler Inhale 1-2  puffs into the lungs every 6 (six) hours as needed for wheezing or shortness of breath. 6.7 g Aliea Bobe E, PA-C   amoxicillin -clavulanate (AUGMENTIN ) 875-125 MG tablet Take 1 tablet by mouth every 12 (twelve) hours. 14 tablet Berdine Rasmusson E, PA-C   azithromycin  (ZITHROMAX ) 250 MG tablet Take 2 tablets (500 mg total) by mouth daily for 1 day, THEN 1 tablet (250 mg total) daily for 4 days. 6 each Nikolos Billig E, PA-C      PDMP not reviewed this encounter.   Marylene Rocky FORBES DEVONNA 10/05/23 2017

## 2023-10-05 NOTE — ED Notes (Addendum)
 Oral Tylenol  standing orders not met. Last dose of Tylenol  was at 3:30 PM. 1000 mg taken in all at home.

## 2023-10-05 NOTE — ED Triage Notes (Addendum)
 Pt presents with complaints of fevers, headaches, cough, chills, and decrease in appetite x 3 days. Currently rates headache pain an 8/10. OTC Tylenol  taken every 4 hours. Last dose was at 3:30 PM today. Denies sick contacts. Erin PA made aware of pt's temperature.

## 2023-10-05 NOTE — ED Notes (Signed)
 Noticed patient's oxygen was low when I entered room to take pt to x-ray. Informed Erin, PA.

## 2023-10-05 NOTE — ED Notes (Signed)
 This RN did acknowledge nasal cannula O2 order at this time. 96% on room air. Pt denies SOB.
